# Patient Record
Sex: Female | Born: 1955 | Hispanic: No | Marital: Married | Smoking: Never smoker
Health system: Southern US, Community
[De-identification: ages and names within clinical notes are randomized; demographics above are authoritative.]

## PROBLEM LIST (undated history)

## (undated) DIAGNOSIS — L509 Urticaria, unspecified: Secondary | ICD-10-CM

## (undated) DIAGNOSIS — I10 Essential (primary) hypertension: Secondary | ICD-10-CM

## (undated) DIAGNOSIS — N200 Calculus of kidney: Secondary | ICD-10-CM

## (undated) DIAGNOSIS — H9190 Unspecified hearing loss, unspecified ear: Secondary | ICD-10-CM

## (undated) DIAGNOSIS — E785 Hyperlipidemia, unspecified: Secondary | ICD-10-CM

## (undated) DIAGNOSIS — K602 Anal fissure, unspecified: Secondary | ICD-10-CM

## (undated) DIAGNOSIS — M1990 Unspecified osteoarthritis, unspecified site: Secondary | ICD-10-CM

## (undated) HISTORY — PX: TONSILLECTOMY: SUR1361

## (undated) HISTORY — DX: Urticaria, unspecified: L50.9

## (undated) HISTORY — DX: Essential (primary) hypertension: I10

## (undated) HISTORY — DX: Anal fissure, unspecified: K60.2

## (undated) HISTORY — DX: Unspecified hearing loss, unspecified ear: H91.90

## (undated) HISTORY — PX: OTHER SURGICAL HISTORY: SHX169

## (undated) HISTORY — DX: Unspecified osteoarthritis, unspecified site: M19.90

## (undated) HISTORY — PX: JOINT REPLACEMENT: SHX530

## (undated) HISTORY — PX: ADENOIDECTOMY: SUR15

## (undated) HISTORY — PX: ROTATOR CUFF REPAIR: SHX139

## (undated) HISTORY — DX: Hyperlipidemia, unspecified: E78.5

---
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
## 2011-08-13 ENCOUNTER — Ambulatory Visit
Admission: RE | Admit: 2011-08-13 | Discharge: 2011-08-13 | Disposition: A | Discharge status: Home / Self Care | Payer: BC Managed Care – PPO | Source: Ambulatory Visit | Attending: Family Medicine | Admitting: Family Medicine

## 2011-08-13 ENCOUNTER — Other Ambulatory Visit: Payer: Self-pay | Admitting: Family Medicine

## 2011-08-13 DIAGNOSIS — M25519 Pain in unspecified shoulder: Secondary | ICD-10-CM

## 2012-07-07 ENCOUNTER — Other Ambulatory Visit: Payer: Self-pay | Admitting: Chiropractic Medicine

## 2012-07-07 ENCOUNTER — Ambulatory Visit
Admission: RE | Admit: 2012-07-07 | Discharge: 2012-07-07 | Disposition: A | Discharge status: Home / Self Care | Payer: BC Managed Care – PPO | Source: Ambulatory Visit | Attending: Chiropractor | Admitting: Chiropractor

## 2012-07-07 DIAGNOSIS — R52 Pain, unspecified: Secondary | ICD-10-CM

## 2013-01-05 ENCOUNTER — Other Ambulatory Visit: Payer: Self-pay | Admitting: Family Medicine

## 2013-01-05 DIAGNOSIS — Z1231 Encounter for screening mammogram for malignant neoplasm of breast: Secondary | ICD-10-CM

## 2013-01-05 DIAGNOSIS — M899 Disorder of bone, unspecified: Secondary | ICD-10-CM

## 2013-02-02 ENCOUNTER — Other Ambulatory Visit: Payer: BC Managed Care – PPO

## 2013-02-02 ENCOUNTER — Ambulatory Visit: Payer: BC Managed Care – PPO

## 2013-02-18 ENCOUNTER — Ambulatory Visit
Admission: RE | Admit: 2013-02-18 | Discharge: 2013-02-18 | Disposition: A | Discharge status: Home / Self Care | Payer: BC Managed Care – PPO | Source: Ambulatory Visit | Attending: Family Medicine | Admitting: Family Medicine

## 2013-02-18 DIAGNOSIS — M899 Disorder of bone, unspecified: Secondary | ICD-10-CM

## 2013-02-18 DIAGNOSIS — Z1231 Encounter for screening mammogram for malignant neoplasm of breast: Secondary | ICD-10-CM

## 2013-03-24 ENCOUNTER — Ambulatory Visit: Payer: Self-pay | Admitting: Podiatry

## 2014-04-26 ENCOUNTER — Other Ambulatory Visit: Payer: Self-pay | Admitting: Family Medicine

## 2014-04-26 DIAGNOSIS — Z1231 Encounter for screening mammogram for malignant neoplasm of breast: Secondary | ICD-10-CM

## 2014-05-02 ENCOUNTER — Ambulatory Visit
Admission: RE | Admit: 2014-05-02 | Discharge: 2014-05-02 | Disposition: A | Discharge status: Home / Self Care | Payer: BC Managed Care – PPO | Source: Ambulatory Visit | Attending: Family Medicine | Admitting: Family Medicine

## 2014-05-02 ENCOUNTER — Encounter (INDEPENDENT_AMBULATORY_CARE_PROVIDER_SITE_OTHER): Payer: Self-pay

## 2014-05-02 DIAGNOSIS — Z1231 Encounter for screening mammogram for malignant neoplasm of breast: Secondary | ICD-10-CM

## 2014-08-21 ENCOUNTER — Other Ambulatory Visit: Payer: Self-pay | Admitting: Family Medicine

## 2014-08-21 ENCOUNTER — Ambulatory Visit
Admission: RE | Admit: 2014-08-21 | Discharge: 2014-08-21 | Disposition: A | Discharge status: Home / Self Care | Payer: BC Managed Care – PPO | Source: Ambulatory Visit | Attending: Family Medicine | Admitting: Family Medicine

## 2014-08-21 DIAGNOSIS — R05 Cough: Secondary | ICD-10-CM

## 2014-08-21 DIAGNOSIS — R0781 Pleurodynia: Secondary | ICD-10-CM

## 2014-08-21 DIAGNOSIS — R059 Cough, unspecified: Secondary | ICD-10-CM

## 2014-10-16 ENCOUNTER — Encounter (HOSPITAL_COMMUNITY): Payer: Self-pay | Admitting: Emergency Medicine

## 2014-10-16 ENCOUNTER — Emergency Department (HOSPITAL_COMMUNITY)
Admission: EM | Admit: 2014-10-16 | Discharge: 2014-10-16 | Disposition: A | Discharge status: Home / Self Care | Payer: BC Managed Care – PPO | Source: Home / Self Care | Attending: Family Medicine | Admitting: Family Medicine

## 2014-10-16 ENCOUNTER — Ambulatory Visit (HOSPITAL_COMMUNITY): Payer: BC Managed Care – PPO | Attending: Family Medicine

## 2014-10-16 ENCOUNTER — Emergency Department (HOSPITAL_COMMUNITY): Payer: BC Managed Care – PPO

## 2014-10-16 DIAGNOSIS — R918 Other nonspecific abnormal finding of lung field: Secondary | ICD-10-CM | POA: Insufficient documentation

## 2014-10-16 DIAGNOSIS — J069 Acute upper respiratory infection, unspecified: Secondary | ICD-10-CM | POA: Diagnosis present

## 2014-10-16 DIAGNOSIS — R059 Cough, unspecified: Secondary | ICD-10-CM

## 2014-10-16 DIAGNOSIS — R509 Fever, unspecified: Secondary | ICD-10-CM | POA: Diagnosis not present

## 2014-10-16 DIAGNOSIS — J189 Pneumonia, unspecified organism: Secondary | ICD-10-CM

## 2014-10-16 DIAGNOSIS — R05 Cough: Secondary | ICD-10-CM

## 2014-10-16 HISTORY — DX: Calculus of kidney: N20.0

## 2014-10-16 MED ORDER — LEVOFLOXACIN 500 MG PO TABS: 500.0000 mg | ORAL_TABLET | Freq: Every day | ORAL | Status: DC

## 2014-10-16 NOTE — ED Notes (Signed)
Pt has been suffering from a cough, fever, generalized weakness and pain since Friday, January 22.  Pt has tried night time medication and Aleve and Advil during the day, with little relief.

## 2014-10-16 NOTE — ED Provider Notes (Signed)
Beth Aguilar is a 59 y.o. female who presents to Urgent Care today for cough. Patient has a several day history of cough fever fatigue and nausea. Her maximum temperature at home was 102.11F. She denies any vomiting diarrhea or abdominal pain or chest pain. She notes wheezing and coarse breathing. She's tried over-the-counter medications which helped a little.   Past Medical History  Diagnosis Date  . Kidney stone    Past Surgical History  Procedure Laterality Date  . Cesarean section      x2  . Tonsillectomy    . Joint replacement     History  Substance Use Topics  . Smoking status: Never Smoker   . Smokeless tobacco: Not on file  . Alcohol Use: No   ROS as above Medications: No current facility-administered medications for this encounter.   Current Outpatient Prescriptions  Medication Sig Dispense Refill  . simvastatin (ZOCOR) 20 MG tablet Take 20 mg by mouth daily.    Marland Kitchen. levofloxacin (LEVAQUIN) 500 MG tablet Take 1 tablet (500 mg total) by mouth daily. 10 tablet 0   Allergies  Allergen Reactions  . Compazine [Prochlorperazine]   . Sulfa Antibiotics      Exam:  BP 134/84 mmHg  Pulse 98  Temp(Src) 99.5 F (37.5 C) (Oral)  Resp 18  SpO2 98% Gen: Well NAD nontoxic appearing HEENT: EOMI,  MMM posterior pharynx with minimal cobblestoning. Normal tympanic membranes bilaterally. Lungs: Normal work of breathing. Profound bilateral rhonchi Heart: RRR no MRG Abd: NABS, Soft. Nondistended, Nontender Exts: Brisk capillary refill, warm and well perfused.   No results found for this or any previous visit (from the past 24 hour(s)). Dg Chest 2 View  10/16/2014   CLINICAL DATA:  Upper respiratory infection and fever  EXAM: CHEST  2 VIEW  COMPARISON:  08/21/2014  FINDINGS: There is a left lower lobe infiltrate, new. The right lung is clear. There are no pleural effusions. Hilar, mediastinal and cardiac contours appear unremarkable. Pulmonary vasculature is normal.  IMPRESSION:  Left lower lobe infiltrate, likely pneumonia in this clinical setting.   Electronically Signed   By: Ellery Plunkaniel R Mitchell M.D.   On: 10/16/2014 11:39    Assessment and Plan: 59 y.o. female with community-acquired pneumonia. Treat with Levaquin. Continue over-the-counter medications. Return as needed.  Discussed warning signs or symptoms. Please see discharge instructions. Patient expresses understanding.     Rodolph BongEvan S Daryan Buell, MD 10/16/14 [phone]

## 2014-10-16 NOTE — ED Notes (Signed)
Pt is unable to sign error with e-signature board

## 2014-10-16 NOTE — Discharge Instructions (Signed)
Thank you for coming in today. °Call or go to the emergency room if you get worse, have trouble breathing, have chest pains, or palpitations.  ° °Pneumonia °Pneumonia is an infection of the lungs.  °CAUSES °Pneumonia may be caused by bacteria or a virus. Usually, these infections are caused by breathing infectious particles into the lungs (respiratory tract). °SIGNS AND SYMPTOMS  °· Cough. °· Fever. °· Chest pain. °· Increased rate of breathing. °· Wheezing. °· Mucus production. °DIAGNOSIS  °If you have the common symptoms of pneumonia, your health care provider will typically confirm the diagnosis with a chest X-ray. The X-ray will show an abnormality in the lung (pulmonary infiltrate) if you have pneumonia. Other tests of your blood, urine, or sputum may be done to find the specific cause of your pneumonia. Your health care provider may also do tests (blood gases or pulse oximetry) to see how well your lungs are working. °TREATMENT  °Some forms of pneumonia may be spread to other people when you cough or sneeze. You may be asked to wear a mask before and during your exam. Pneumonia that is caused by bacteria is treated with antibiotic medicine. Pneumonia that is caused by the influenza virus may be treated with an antiviral medicine. Most other viral infections must run their course. These infections will not respond to antibiotics.  °HOME CARE INSTRUCTIONS  °· Cough suppressants may be used if you are losing too much rest. However, coughing protects you by clearing your lungs. You should avoid using cough suppressants if you can. °· Your health care provider may have prescribed medicine if he or she thinks your pneumonia is caused by bacteria or influenza. Finish your medicine even if you start to feel better. °· Your health care provider may also prescribe an expectorant. This loosens the mucus to be coughed up. °· Take medicines only as directed by your health care provider. °· Do not smoke. Smoking is a common  cause of bronchitis and can contribute to pneumonia. If you are a smoker and continue to smoke, your cough may last several weeks after your pneumonia has cleared. °· A cold steam vaporizer or humidifier in your room or home may help loosen mucus. °· Coughing is often worse at night. Sleeping in a semi-upright position in a recliner or using a couple pillows under your head will help with this. °· Get rest as you feel it is needed. Your body will usually let you know when you need to rest. °PREVENTION °A pneumococcal shot (vaccine) is available to prevent a common bacterial cause of pneumonia. This is usually suggested for: °· People over 65 years old. °· Patients on chemotherapy. °· People with chronic lung problems, such as bronchitis or emphysema. °· People with immune system problems. °If you are over 65 or have a high risk condition, you may receive the pneumococcal vaccine if you have not received it before. In some countries, a routine influenza vaccine is also recommended. This vaccine can help prevent some cases of pneumonia. You may be offered the influenza vaccine as part of your care. °If you smoke, it is time to quit. You may receive instructions on how to stop smoking. Your health care provider can provide medicines and counseling to help you quit. °SEEK MEDICAL CARE IF: °You have a fever. °SEEK IMMEDIATE MEDICAL CARE IF:  °· Your illness becomes worse. This is especially true if you are elderly or weakened from any other disease. °· You cannot control your cough with suppressants and   are losing sleep. °· You begin coughing up blood. °· You develop pain which is getting worse or is uncontrolled with medicines. °· Any of the symptoms which initially brought you in for treatment are getting worse rather than better. °· You develop shortness of breath or chest pain. °MAKE SURE YOU:  °· Understand these instructions. °· Will watch your condition. °· Will get help right away if you are not doing well or get  worse. °Document Released: 09/08/2005 Document Revised: 01/23/2014 Document Reviewed: 11/28/2010 °[employer]® Patient Information ©2015 [employer]. This information is not intended to replace advice given to you by your health care provider. Make sure you discuss any questions you have with your health care provider. ° °

## 2014-11-02 ENCOUNTER — Ambulatory Visit (INDEPENDENT_AMBULATORY_CARE_PROVIDER_SITE_OTHER): Payer: BC Managed Care – PPO | Admitting: Internal Medicine

## 2014-11-02 ENCOUNTER — Encounter: Payer: Self-pay | Admitting: Internal Medicine

## 2014-11-02 VITALS — BP 122/64 | HR 78 | Temp 98.3°F | Resp 12 | Ht 60.0 in | Wt 125.8 lb

## 2014-11-02 DIAGNOSIS — Z0000 Encounter for general adult medical examination without abnormal findings: Secondary | ICD-10-CM

## 2014-11-02 DIAGNOSIS — E785 Hyperlipidemia, unspecified: Secondary | ICD-10-CM

## 2014-11-02 MED ORDER — SIMVASTATIN 20 MG PO TABS: 20.0000 mg | ORAL_TABLET | Freq: Every day | ORAL | Status: DC

## 2014-11-02 NOTE — Patient Instructions (Addendum)
We will check your blood work which you can come get done over spring break  and call you back with the results.   We recommend two groups for Ob/Gyn: [facility] and [facility]  If you decide that you want to do something about the veins on your upper legs just let us know or see a vascular doctor (if you need a referral call our office).  We will see you back in the summer for your physical.   Health Maintenance Adopting a healthy lifestyle and getting preventive care can go a long way to promote health and wellness. Talk with your health care provider about what schedule of regular examinations is right for you. This is a good chance for you to check in with your provider about disease prevention and staying healthy. In between checkups, there are plenty of things you can do on your own. Experts have done a lot of research about which lifestyle changes and preventive measures are most likely to keep you healthy. Ask your health care provider for more information. WEIGHT AND DIET  Eat a healthy diet  Be sure to include plenty of vegetables, fruits, low-fat dairy products, and lean protein.  Do not eat a lot of foods high in solid fats, added sugars, or salt.  Get regular exercise. This is one of the most important things you can do for your health.  Most adults should exercise for at least 150 minutes each week. The exercise should increase your heart rate and make you sweat (moderate-intensity exercise).  Most adults should also do strengthening exercises at least twice a week. This is in addition to the moderate-intensity exercise.  Maintain a healthy weight  Body mass index (BMI) is a measurement that can be used to identify possible weight problems. It estimates body fat based on height and weight. Your health care provider can help determine your BMI and help you achieve or maintain a healthy weight.  For females 41 years of age and older:   A BMI below 18.5 is  considered underweight.  A BMI of 18.5 to 24.9 is normal.  A BMI of 25 to 29.9 is considered overweight.  A BMI of 30 and above is considered obese.  Watch levels of cholesterol and blood lipids  You should start having your blood tested for lipids and cholesterol at 58 years of age, then have this test every 5 years.  You may need to have your cholesterol levels checked more often if:  Your lipid or cholesterol levels are high.  You are older than 58 years of age.  You are at high risk for heart disease.  CANCER SCREENING   Lung Cancer  Lung cancer screening is recommended for adults 80-45 years old who are at high risk for lung cancer because of a history of smoking.  A yearly low-dose CT scan of the lungs is recommended for people who:  Currently smoke.  Have quit within the past 15 years.  Have at least a 30-pack-year history of smoking. A pack year is smoking an average of one pack of cigarettes a day for 1 year.  Yearly screening should continue until it has been 15 years since you quit.  Yearly screening should stop if you develop a health problem that would prevent you from having lung cancer treatment.  Breast Cancer  Practice breast self-awareness. This means understanding how your breasts normally appear and feel.  It also means doing regular breast self-exams. Let your health care  provider know about any changes, no matter how small.  If you are in your 20s or 30s, you should have a clinical breast exam (CBE) by a health care provider every 1-3 years as part of a regular health exam.  If you are 79 or older, have a CBE every year. Also consider having a breast X-ray (mammogram) every year.  If you have a family history of breast cancer, talk to your health care provider about genetic screening.  If you are at high risk for breast cancer, talk to your health care provider about having an MRI and a mammogram every year.  Breast cancer gene (BRCA)  assessment is recommended for women who have family members with BRCA-related cancers. BRCA-related cancers include:  Breast.  Ovarian.  Tubal.  Peritoneal cancers.  Results of the assessment will determine the need for genetic counseling and BRCA1 and BRCA2 testing. Cervical Cancer Routine pelvic examinations to screen for cervical cancer are no longer recommended for nonpregnant women who are considered low risk for cancer of the pelvic organs (ovaries, uterus, and vagina) and who do not have symptoms. A pelvic examination may be necessary if you have symptoms including those associated with pelvic infections. Ask your health care provider if a screening pelvic exam is right for you.   The Pap test is the screening test for cervical cancer for women who are considered at risk.  If you had a hysterectomy for a problem that was not cancer or a condition that could lead to cancer, then you no longer need Pap tests.  If you are older than 65 years, and you have had normal Pap tests for the past 10 years, you no longer need to have Pap tests.  If you have had past treatment for cervical cancer or a condition that could lead to cancer, you need Pap tests and screening for cancer for at least 20 years after your treatment.  If you no longer get a Pap test, assess your risk factors if they change (such as having a new sexual partner). This can affect whether you should start being screened again.  Some women have medical problems that increase their chance of getting cervical cancer. If this is the case for you, your health care provider may recommend more frequent screening and Pap tests.  The human papillomavirus (HPV) test is another test that may be used for cervical cancer screening. The HPV test looks for the virus that can cause cell changes in the cervix. The cells collected during the Pap test can be tested for HPV.  The HPV test can be used to screen women 5 years of age and older.  Getting tested for HPV can extend the interval between normal Pap tests from three to five years.  An HPV test also should be used to screen women of any age who have unclear Pap test results.  After 58 years of age, women should have HPV testing as often as Pap tests.  Colorectal Cancer  This type of cancer can be detected and often prevented.  Routine colorectal cancer screening usually begins at 58 years of age and continues through 58 years of age.  Your health care provider may recommend screening at an earlier age if you have risk factors for colon cancer.  Your health care provider may also recommend using home test kits to check for hidden blood in the stool.  A small camera at the end of a tube can be used to examine your colon  directly (sigmoidoscopy or colonoscopy). This is done to check for the earliest forms of colorectal cancer.  Routine screening usually begins at age 21.  Direct examination of the colon should be repeated every 5-10 years through 58 years of age. However, you may need to be screened more often if early forms of precancerous polyps or small growths are found. Skin Cancer  Check your skin from head to toe regularly.  Tell your health care provider about any new moles or changes in moles, especially if there is a change in a mole's shape or color.  Also tell your health care provider if you have a mole that is larger than the size of a pencil eraser.  Always use sunscreen. Apply sunscreen liberally and repeatedly throughout the day.  Protect yourself by wearing long sleeves, pants, a wide-brimmed hat, and sunglasses whenever you are outside. HEART DISEASE, DIABETES, AND HIGH BLOOD PRESSURE   Have your blood pressure checked at least every 1-2 years. High blood pressure causes heart disease and increases the risk of stroke.  If you are between 54 years and 57 years old, ask your health care provider if you should take aspirin to prevent  strokes.  Have regular diabetes screenings. This involves taking a blood sample to check your fasting blood sugar level.  If you are at a normal weight and have a low risk for diabetes, have this test once every three years after 58 years of age.  If you are overweight and have a high risk for diabetes, consider being tested at a younger age or more often. PREVENTING INFECTION  Hepatitis B  If you have a higher risk for hepatitis B, you should be screened for this virus. You are considered at high risk for hepatitis B if:  You were born in a country where hepatitis B is common. Ask your health care provider which countries are considered high risk.  Your parents were born in a high-risk country, and you have not been immunized against hepatitis B (hepatitis B vaccine).  You have HIV or AIDS.  You use needles to inject street drugs.  You live with someone who has hepatitis B.  You have had sex with someone who has hepatitis B.  You get hemodialysis treatment.  You take certain medicines for conditions, including cancer, organ transplantation, and autoimmune conditions. Hepatitis C  Blood testing is recommended for:  Everyone born from [birth year] through 1964.  Anyone with known risk factors for hepatitis C. Sexually transmitted infections (STIs)  You should be screened for sexually transmitted infections (STIs) including gonorrhea and chlamydia if:  You are sexually active and are younger than 58 years of age.  You are older than 58 years of age and your health care provider tells you that you are at risk for this type of infection.  Your sexual activity has changed since you were last screened and you are at an increased risk for chlamydia or gonorrhea. Ask your health care provider if you are at risk.  If you do not have HIV, but are at risk, it may be recommended that you take a prescription medicine daily to prevent HIV infection. This is called pre-exposure prophylaxis  (PrEP). You are considered at risk if:  You are sexually active and do not regularly use condoms or know the HIV status of your partner(s).  You take drugs by injection.  You are sexually active with a partner who has HIV. Talk with your health care provider about whether you are  at high risk of being infected with HIV. If you choose to begin PrEP, you should first be tested for HIV. You should then be tested every 3 months for as long as you are taking PrEP.  PREGNANCY   If you are premenopausal and you may become pregnant, ask your health care provider about preconception counseling.  If you may become pregnant, take 400 to 800 micrograms (mcg) of folic acid every day.  If you want to prevent pregnancy, talk to your health care provider about birth control (contraception). OSTEOPOROSIS AND MENOPAUSE   Osteoporosis is a disease in which the bones lose minerals and strength with aging. This can result in serious bone fractures. Your risk for osteoporosis can be identified using a bone density scan.  If you are 36 years of age or older, or if you are at risk for osteoporosis and fractures, ask your health care provider if you should be screened.  Ask your health care provider whether you should take a calcium or vitamin D supplement to lower your risk for osteoporosis.  Menopause may have certain physical symptoms and risks.  Hormone replacement therapy may reduce some of these symptoms and risks. Talk to your health care provider about whether hormone replacement therapy is right for you.  HOME CARE INSTRUCTIONS   Schedule regular health, dental, and eye exams.  Stay current with your immunizations.   Do not use any tobacco products including cigarettes, chewing tobacco, or electronic cigarettes.  If you are pregnant, do not drink alcohol.  If you are breastfeeding, limit how much and how often you drink alcohol.  Limit alcohol intake to no more than 1 drink per day for  nonpregnant women. One drink equals 12 ounces of beer, 5 ounces of wine, or 1 ounces of hard liquor.  Do not use street drugs.  Do not share needles.  Ask your health care provider for help if you need support or information about quitting drugs.  Tell your health care provider if you often feel depressed.  Tell your health care provider if you have ever been abused or do not feel safe at home. Document Released: 03/24/2011 Document Revised: 01/23/2014 Document Reviewed: 08/10/2013 [facility] Patient Information 2015 [place], [place]. This information is not intended to replace advice given to you by your health care provider. Make sure you discuss any questions you have with your health care provider.

## 2014-11-02 NOTE — Progress Notes (Signed)
Pre visit review using our clinic review tool, if applicable. No additional management support is needed unless otherwise documented below in the visit note. 

## 2014-11-04 ENCOUNTER — Encounter: Payer: Self-pay | Admitting: Internal Medicine

## 2014-11-04 DIAGNOSIS — E785 Hyperlipidemia, unspecified: Secondary | ICD-10-CM | POA: Insufficient documentation

## 2014-11-04 DIAGNOSIS — Z0000 Encounter for general adult medical examination without abnormal findings: Secondary | ICD-10-CM | POA: Insufficient documentation

## 2014-11-04 NOTE — Progress Notes (Signed)
   Subjective:    Patient ID: Beth Aguilar, female    DOB: 01/14/1956, 59 y.o.   MRN: [id number]  HPI The patient is a 59 YO female who comes in for wellness as a new patient. No new complaints although she does have some veins that are prominent on her upper legs. Denies signs of depression, anxiety, acid reflux, chest pains, SOB. 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  [facility], social history, medications, allergies, problem list reviewed and updated.  Review of Systems  Constitutional: Negative for fever, activity change, appetite change, fatigue and unexpected weight change.  HENT: Negative.   Eyes: Negative.   Respiratory: Negative for cough, chest tightness, shortness of breath and wheezing.   Cardiovascular: Negative for chest pain, palpitations and leg swelling.  Gastrointestinal: Negative for abdominal pain, diarrhea, constipation and abdominal distention.  Genitourinary: Negative.   Musculoskeletal: Negative.   Skin: Negative.   Neurological: Negative.   Psychiatric/Behavioral: Negative.       Objective:   Physical Exam  Constitutional: She is oriented to person, place, and time. She appears well-developed and well-nourished.  HENT:  Head: Normocephalic and atraumatic.  Right Ear: External ear normal.  Left Ear: External ear normal.  Eyes: EOM are normal.  Neck: Normal range of motion.  Cardiovascular: Normal rate and regular rhythm.   No murmur heard. Carotids without bruits.   Pulmonary/Chest: Effort normal and breath sounds normal. No respiratory distress. She has no wheezes.  Abdominal: Soft. Bowel sounds are normal.  Musculoskeletal: She exhibits no edema.  Neurological: She is alert and oriented to person, place, and time. Coordination normal.  Skin: Skin is warm and dry.  Some prominent sclerosed veins on the upper leg. No signs of varicose veins at this time.  Psychiatric: She has a normal mood and affect. Her behavior is normal.    Filed Vitals:   11/02/14 1416  BP: 122/64  Pulse: 78  Temp: 98.3 F (36.8 C)  TempSrc: Oral  Resp: 12  Height: 5' (1.524 m)  Weight: 125 lb 12.8 oz (57.063 kg)  SpO2: 97%      Assessment & Plan:

## 2014-11-04 NOTE — Assessment & Plan Note (Signed)
BP normal, she exercises, non-smoker. Declines all immunizations today. Mammogram and PAP smear up to date and sees Gyn for those. Colonoscopy done and next due in 2025. Check BMP.

## 2014-12-19 ENCOUNTER — Other Ambulatory Visit (INDEPENDENT_AMBULATORY_CARE_PROVIDER_SITE_OTHER): Payer: BC Managed Care – PPO

## 2014-12-19 DIAGNOSIS — Z0000 Encounter for general adult medical examination without abnormal findings: Secondary | ICD-10-CM

## 2014-12-19 LAB — LIPID PANEL
CHOLESTEROL: 173 mg/dL (ref 0–200)
HDL: 49.2 mg/dL (ref >39.00)
LDL CALC: 99 mg/dL (ref 0–99)
NONHDL: 123.8
Total CHOL/HDL Ratio: 4
Triglycerides: 123 mg/dL (ref 0.0–149.0)
VLDL: 24.6 mg/dL (ref 0.0–40.0)

## 2014-12-19 LAB — COMPREHENSIVE METABOLIC PANEL
ALK PHOS: 57 U/L (ref 39–117)
ALT: 17 U/L (ref 0–35)
AST: 19 U/L (ref 0–37)
Albumin: 4.2 g/dL (ref 3.5–5.2)
BUN: 14 mg/dL (ref 6–23)
CALCIUM: 9.8 mg/dL (ref 8.4–10.5)
CHLORIDE: 103 meq/L (ref 96–112)
CO2: 30 mEq/L (ref 19–32)
CREATININE: 0.85 mg/dL (ref 0.40–1.20)
GFR: 73.03 mL/min (ref >60.00)
GLUCOSE: 95 mg/dL (ref 70–99)
POTASSIUM: 4.1 meq/L (ref 3.5–5.1)
SODIUM: 139 meq/L (ref 135–145)
TOTAL PROTEIN: 7.2 g/dL (ref 6.0–8.3)
Total Bilirubin: 0.4 mg/dL (ref 0.2–1.2)

## 2014-12-22 ENCOUNTER — Telehealth: Payer: Self-pay

## 2014-12-22 NOTE — Telephone Encounter (Signed)
Left message on voice mail advising pt of dr Loann Quillkollars findings and to call back if any further questions

## 2014-12-22 NOTE — Telephone Encounter (Signed)
-----   Message from Judie BonusElizabeth A Kollar, MD sent at 12/21/2014  9:58 AM EDT ----- Please call her and let her know that her labs are normal and cholesterol is good.

## 2015-02-17 ENCOUNTER — Ambulatory Visit: Payer: BC Managed Care – PPO | Admitting: Family Medicine

## 2015-03-02 ENCOUNTER — Telehealth: Payer: Self-pay | Admitting: Internal Medicine

## 2015-03-02 NOTE — Telephone Encounter (Signed)
She can just call the vein center to get in with them and likely does not need a referral.

## 2015-03-02 NOTE — Telephone Encounter (Signed)
Pt called in and would like a referral to a vein specialist about the veins on her legs.

## 2015-03-05 NOTE — Telephone Encounter (Signed)
Left message informing patient to call the vein clinic and and schedule an appointment.

## 2015-04-30 ENCOUNTER — Encounter: Payer: BC Managed Care – PPO | Admitting: Internal Medicine

## 2015-05-02 LAB — HM MAMMOGRAPHY

## 2015-05-02 LAB — HM PAP SMEAR

## 2015-05-03 LAB — HM DEXA SCAN

## 2015-05-08 ENCOUNTER — Ambulatory Visit (INDEPENDENT_AMBULATORY_CARE_PROVIDER_SITE_OTHER): Payer: BC Managed Care – PPO | Admitting: Internal Medicine

## 2015-05-08 ENCOUNTER — Ambulatory Visit (INDEPENDENT_AMBULATORY_CARE_PROVIDER_SITE_OTHER)
Admission: RE | Admit: 2015-05-08 | Discharge: 2015-05-08 | Disposition: A | Discharge status: Home / Self Care | Payer: BC Managed Care – PPO | Source: Ambulatory Visit | Attending: Internal Medicine | Admitting: Internal Medicine

## 2015-05-08 ENCOUNTER — Encounter: Payer: Self-pay | Admitting: Internal Medicine

## 2015-05-08 ENCOUNTER — Other Ambulatory Visit (INDEPENDENT_AMBULATORY_CARE_PROVIDER_SITE_OTHER): Payer: BC Managed Care – PPO

## 2015-05-08 VITALS — BP 140/90 | HR 90 | Temp 97.5°F | Resp 16 | Ht 60.0 in | Wt 126.0 lb

## 2015-05-08 DIAGNOSIS — R059 Cough, unspecified: Secondary | ICD-10-CM

## 2015-05-08 DIAGNOSIS — J302 Other seasonal allergic rhinitis: Secondary | ICD-10-CM | POA: Insufficient documentation

## 2015-05-08 DIAGNOSIS — R05 Cough: Secondary | ICD-10-CM | POA: Diagnosis not present

## 2015-05-08 DIAGNOSIS — J0121 Acute recurrent ethmoidal sinusitis: Secondary | ICD-10-CM | POA: Insufficient documentation

## 2015-05-08 DIAGNOSIS — Z0000 Encounter for general adult medical examination without abnormal findings: Secondary | ICD-10-CM | POA: Diagnosis not present

## 2015-05-08 LAB — TSH: TSH: 1.52 u[IU]/mL (ref 0.35–4.50)

## 2015-05-08 MED ORDER — AMOXICILLIN 875 MG PO TABS: 875.0000 mg | ORAL_TABLET | Freq: Two times a day (BID) | ORAL | Status: AC

## 2015-05-08 MED ORDER — AZELASTINE HCL 0.1 % NA SOLN: 2.0000 | Freq: Two times a day (BID) | NASAL | Status: DC

## 2015-05-08 NOTE — Patient Instructions (Signed)
Preventive Care for Adults A healthy lifestyle and preventive care can promote health and wellness. Preventive health guidelines for women include the following key practices.  A routine yearly physical is a good way to check with your health care provider about your health and preventive screening. It is a chance to share any concerns and updates on your health and to receive a thorough exam.  Visit your dentist for a routine exam and preventive care every 6 months. Brush your teeth twice a day and floss once a day. Good oral hygiene prevents tooth decay and gum disease.  The frequency of eye exams is based on your age, health, family medical history, use of contact lenses, and other factors. Follow your health care provider's recommendations for frequency of eye exams.  Eat a healthy diet. Foods like vegetables, fruits, whole grains, low-fat dairy products, and lean protein foods contain the nutrients you need without too many calories. Decrease your intake of foods high in solid fats, added sugars, and salt. Eat the right amount of calories for you.Get information about a proper diet from your health care provider, if necessary.  Regular physical exercise is one of the most important things you can do for your health. Most adults should get at least 150 minutes of moderate-intensity exercise (any activity that increases your heart rate and causes you to sweat) each week. In addition, most adults need muscle-strengthening exercises on 2 or more days a week.  Maintain a healthy weight. The body mass index (BMI) is a screening tool to identify possible weight problems. It provides an estimate of body fat based on height and weight. Your health care provider can find your BMI and can help you achieve or maintain a healthy weight.For adults 20 years and older:  A BMI below 18.5 is considered underweight.  A BMI of 18.5 to 24.9 is normal.  A BMI of 25 to 29.9 is considered overweight.  A BMI of  30 and above is considered obese.  Maintain normal blood lipids and cholesterol levels by exercising and minimizing your intake of saturated fat. Eat a balanced diet with plenty of fruit and vegetables. Blood tests for lipids and cholesterol should begin at age 76 and be repeated every 5 years. If your lipid or cholesterol levels are high, you are over 50, or you are at high risk for heart disease, you may need your cholesterol levels checked more frequently.Ongoing high lipid and cholesterol levels should be treated with medicines if diet and exercise are not working.  If you smoke, find out from your health care provider how to quit. If you do not use tobacco, do not start.  Lung cancer screening is recommended for adults aged 22-80 years who are at high risk for developing lung cancer because of a history of smoking. A yearly low-dose CT scan of the lungs is recommended for people who have at least a 30-pack-year history of smoking and are a current smoker or have quit within the past 15 years. A pack year of smoking is smoking an average of 1 pack of cigarettes a day for 1 year (for example: 1 pack a day for 30 years or 2 packs a day for 15 years). Yearly screening should continue until the smoker has stopped smoking for at least 15 years. Yearly screening should be stopped for people who develop a health problem that would prevent them from having lung cancer treatment.  If you are pregnant, do not drink alcohol. If you are breastfeeding,  be very cautious about drinking alcohol. If you are not pregnant and choose to drink alcohol, do not have more than 1 drink per day. One drink is considered to be 12 ounces (355 mL) of beer, 5 ounces (148 mL) of wine, or 1.5 ounces (44 mL) of liquor.  Avoid use of street drugs. Do not share needles with anyone. Ask for help if you need support or instructions about stopping the use of drugs.  High blood pressure causes heart disease and increases the risk of  stroke. Your blood pressure should be checked at least every 1 to 2 years. Ongoing high blood pressure should be treated with medicines if weight loss and exercise do not work.  If you are 75-52 years old, ask your health care provider if you should take aspirin to prevent strokes.  Diabetes screening involves taking a blood sample to check your fasting blood sugar level. This should be done once every 3 years, after age 15, if you are within normal weight and without risk factors for diabetes. Testing should be considered at a younger age or be carried out more frequently if you are overweight and have at least 1 risk factor for diabetes.  Breast cancer screening is essential preventive care for women. You should practice "breast self-awareness." This means understanding the normal appearance and feel of your breasts and may include breast self-examination. Any changes detected, no matter how small, should be reported to a health care provider. Women in their 58s and 30s should have a clinical breast exam (CBE) by a health care provider as part of a regular health exam every 1 to 3 years. After age 16, women should have a CBE every year. Starting at age 53, women should consider having a mammogram (breast X-ray test) every year. Women who have a family history of breast cancer should talk to their health care provider about genetic screening. Women at a high risk of breast cancer should talk to their health care providers about having an MRI and a mammogram every year.  Breast cancer gene (BRCA)-related cancer risk assessment is recommended for women who have family members with BRCA-related cancers. BRCA-related cancers include breast, ovarian, tubal, and peritoneal cancers. Having family members with these cancers may be associated with an increased risk for harmful changes (mutations) in the breast cancer genes BRCA1 and BRCA2. Results of the assessment will determine the need for genetic counseling and  BRCA1 and BRCA2 testing.  Routine pelvic exams to screen for cancer are no longer recommended for nonpregnant women who are considered low risk for cancer of the pelvic organs (ovaries, uterus, and vagina) and who do not have symptoms. Ask your health care provider if a screening pelvic exam is right for you.  If you have had past treatment for cervical cancer or a condition that could lead to cancer, you need Pap tests and screening for cancer for at least 20 years after your treatment. If Pap tests have been discontinued, your risk factors (such as having a new sexual partner) need to be reassessed to determine if screening should be resumed. Some women have medical problems that increase the chance of getting cervical cancer. In these cases, your health care provider may recommend more frequent screening and Pap tests.  The HPV test is an additional test that may be used for cervical cancer screening. The HPV test looks for the virus that can cause the cell changes on the cervix. The cells collected during the Pap test can be  tested for HPV. The HPV test could be used to screen women aged 30 years and older, and should be used in women of any age who have unclear Pap test results. After the age of 30, women should have HPV testing at the same frequency as a Pap test.  Colorectal cancer can be detected and often prevented. Most routine colorectal cancer screening begins at the age of 50 years and continues through age 75 years. However, your health care provider may recommend screening at an earlier age if you have risk factors for colon cancer. On a yearly basis, your health care provider may provide home test kits to check for hidden blood in the stool. Use of a small camera at the end of a tube, to directly examine the colon (sigmoidoscopy or colonoscopy), can detect the earliest forms of colorectal cancer. Talk to your health care provider about this at age 50, when routine screening begins. Direct  exam of the colon should be repeated every 5-10 years through age 75 years, unless early forms of pre-cancerous polyps or small growths are found.  People who are at an increased risk for hepatitis B should be screened for this virus. You are considered at high risk for hepatitis B if:  You were born in a country where hepatitis B occurs often. Talk with your health care provider about which countries are considered high risk.  Your parents were born in a high-risk country and you have not received a shot to protect against hepatitis B (hepatitis B vaccine).  You have HIV or AIDS.  You use needles to inject street drugs.  You live with, or have sex with, someone who has hepatitis B.  You get hemodialysis treatment.  You take certain medicines for conditions like cancer, organ transplantation, and autoimmune conditions.  Hepatitis C blood testing is recommended for all people born from 1945 through 1965 and any individual with known risks for hepatitis C.  Practice safe sex. Use condoms and avoid high-risk sexual practices to reduce the spread of sexually transmitted infections (STIs). STIs include gonorrhea, chlamydia, syphilis, trichomonas, herpes, HPV, and human immunodeficiency virus (HIV). Herpes, HIV, and HPV are viral illnesses that have no cure. They can result in disability, cancer, and death.  You should be screened for sexually transmitted illnesses (STIs) including gonorrhea and chlamydia if:  You are sexually active and are younger than 24 years.  You are older than 24 years and your health care provider tells you that you are at risk for this type of infection.  Your sexual activity has changed since you were last screened and you are at an increased risk for chlamydia or gonorrhea. Ask your health care provider if you are at risk.  If you are at risk of being infected with HIV, it is recommended that you take a prescription medicine daily to prevent HIV infection. This is  called preexposure prophylaxis (PrEP). You are considered at risk if:  You are a heterosexual woman, are sexually active, and are at increased risk for HIV infection.  You take drugs by injection.  You are sexually active with a partner who has HIV.  Talk with your health care provider about whether you are at high risk of being infected with HIV. If you choose to begin PrEP, you should first be tested for HIV. You should then be tested every 3 months for as long as you are taking PrEP.  Osteoporosis is a disease in which the bones lose minerals and strength   with aging. This can result in serious bone fractures or breaks. The risk of osteoporosis can be identified using a bone density scan. Women ages 65 years and over and women at risk for fractures or osteoporosis should discuss screening with their health care providers. Ask your health care provider whether you should take a calcium supplement or vitamin D to reduce the rate of osteoporosis.  Menopause can be associated with physical symptoms and risks. Hormone replacement therapy is available to decrease symptoms and risks. You should talk to your health care provider about whether hormone replacement therapy is right for you.  Use sunscreen. Apply sunscreen liberally and repeatedly throughout the day. You should seek shade when your shadow is shorter than you. Protect yourself by wearing long sleeves, pants, a wide-brimmed hat, and sunglasses year round, whenever you are outdoors.  Once a month, do a whole body skin exam, using a mirror to look at the skin on your back. Tell your health care provider of new moles, moles that have irregular borders, moles that are larger than a pencil eraser, or moles that have changed in shape or color.  Stay current with required vaccines (immunizations).  Influenza vaccine. All adults should be immunized every year.  Tetanus, diphtheria, and acellular pertussis (Td, Tdap) vaccine. Pregnant women should  receive 1 dose of Tdap vaccine during each pregnancy. The dose should be obtained regardless of the length of time since the last dose. Immunization is preferred during the 27th-36th week of gestation. An adult who has not previously received Tdap or who does not know her vaccine status should receive 1 dose of Tdap. This initial dose should be followed by tetanus and diphtheria toxoids (Td) booster doses every 10 years. Adults with an unknown or incomplete history of completing a 3-dose immunization series with Td-containing vaccines should begin or complete a primary immunization series including a Tdap dose. Adults should receive a Td booster every 10 years.  Varicella vaccine. An adult without evidence of immunity to varicella should receive 2 doses or a second dose if she has previously received 1 dose. Pregnant females who do not have evidence of immunity should receive the first dose after pregnancy. This first dose should be obtained before leaving the health care facility. The second dose should be obtained 4-8 weeks after the first dose.  Human papillomavirus (HPV) vaccine. Females aged 13-26 years who have not received the vaccine previously should obtain the 3-dose series. The vaccine is not recommended for use in pregnant females. However, pregnancy testing is not needed before receiving a dose. If a female is found to be pregnant after receiving a dose, no treatment is needed. In that case, the remaining doses should be delayed until after the pregnancy. Immunization is recommended for any person with an immunocompromised condition through the age of 26 years if she did not get any or all doses earlier. During the 3-dose series, the second dose should be obtained 4-8 weeks after the first dose. The third dose should be obtained 24 weeks after the first dose and 16 weeks after the second dose.  Zoster vaccine. One dose is recommended for adults aged 60 years or older unless certain conditions are  present.  Measles, mumps, and rubella (MMR) vaccine. Adults born before 1957 generally are considered immune to measles and mumps. Adults born in 1957 or later should have 1 or more doses of MMR vaccine unless there is a contraindication to the vaccine or there is laboratory evidence of immunity to   each of the three diseases. A routine second dose of MMR vaccine should be obtained at least 28 days after the first dose for students attending postsecondary schools, health care workers, or international travelers. People who received inactivated measles vaccine or an unknown type of measles vaccine during 1963-1967 should receive 2 doses of MMR vaccine. People who received inactivated mumps vaccine or an unknown type of mumps vaccine before 1979 and are at high risk for mumps infection should consider immunization with 2 doses of MMR vaccine. For females of childbearing age, rubella immunity should be determined. If there is no evidence of immunity, females who are not pregnant should be vaccinated. If there is no evidence of immunity, females who are pregnant should delay immunization until after pregnancy. Unvaccinated health care workers born before 1957 who lack laboratory evidence of measles, mumps, or rubella immunity or laboratory confirmation of disease should consider measles and mumps immunization with 2 doses of MMR vaccine or rubella immunization with 1 dose of MMR vaccine.  Pneumococcal 13-valent conjugate (PCV13) vaccine. When indicated, a person who is uncertain of her immunization history and has no record of immunization should receive the PCV13 vaccine. An adult aged 19 years or older who has certain medical conditions and has not been previously immunized should receive 1 dose of PCV13 vaccine. This PCV13 should be followed with a dose of pneumococcal polysaccharide (PPSV23) vaccine. The PPSV23 vaccine dose should be obtained at least 8 weeks after the dose of PCV13 vaccine. An adult aged 19  years or older who has certain medical conditions and previously received 1 or more doses of PPSV23 vaccine should receive 1 dose of PCV13. The PCV13 vaccine dose should be obtained 1 or more years after the last PPSV23 vaccine dose.  Pneumococcal polysaccharide (PPSV23) vaccine. When PCV13 is also indicated, PCV13 should be obtained first. All adults aged 65 years and older should be immunized. An adult younger than age 65 years who has certain medical conditions should be immunized. Any person who resides in a nursing home or long-term care facility should be immunized. An adult smoker should be immunized. People with an immunocompromised condition and certain other conditions should receive both PCV13 and PPSV23 vaccines. People with human immunodeficiency virus (HIV) infection should be immunized as soon as possible after diagnosis. Immunization during chemotherapy or radiation therapy should be avoided. Routine use of PPSV23 vaccine is not recommended for American Indians, [place] Natives, or people younger than 65 years unless there are medical conditions that require PPSV23 vaccine. When indicated, people who have unknown immunization and have no record of immunization should receive PPSV23 vaccine. One-time revaccination 5 years after the first dose of PPSV23 is recommended for people aged 19-64 years who have chronic kidney failure, nephrotic syndrome, asplenia, or immunocompromised conditions. People who received 1-2 doses of PPSV23 before age 65 years should receive another dose of PPSV23 vaccine at age 65 years or later if at least 5 years have passed since the previous dose. Doses of PPSV23 are not needed for people immunized with PPSV23 at or after age 65 years.  Meningococcal vaccine. Adults with asplenia or persistent complement component deficiencies should receive 2 doses of quadrivalent meningococcal conjugate (MenACWY-D) vaccine. The doses should be obtained at least 2 months apart.  Microbiologists working with certain meningococcal bacteria, military recruits, people at risk during an outbreak, and people who travel to or live in countries with a high rate of meningitis should be immunized. A first-year college student up through age   21 years who is living in a residence hall should receive a dose if she did not receive a dose on or after her 16th birthday. Adults who have certain high-risk conditions should receive one or more doses of vaccine.  Hepatitis A vaccine. Adults who wish to be protected from this disease, have certain high-risk conditions, work with hepatitis A-infected animals, work in hepatitis A research labs, or travel to or work in countries with a high rate of hepatitis A should be immunized. Adults who were previously unvaccinated and who anticipate close contact with an international adoptee during the first 60 days after arrival in the [place] from a country with a high rate of hepatitis A should be immunized.  Hepatitis B vaccine. Adults who wish to be protected from this disease, have certain high-risk conditions, may be exposed to blood or other infectious body fluids, are household contacts or sex partners of hepatitis B positive people, are clients or workers in certain care facilities, or travel to or work in countries with a high rate of hepatitis B should be immunized.  Haemophilus influenzae type b (Hib) vaccine. A previously unvaccinated person with asplenia or sickle cell disease or having a scheduled splenectomy should receive 1 dose of Hib vaccine. Regardless of previous immunization, a recipient of a hematopoietic stem cell transplant should receive a 3-dose series 6-12 months after her successful transplant. Hib vaccine is not recommended for adults with HIV infection. Preventive Services / Frequency Ages 64 to 68 years  Blood pressure check.** / Every 1 to 2 years.  Lipid and cholesterol check.** / Every 5 years beginning at age  22.  Clinical breast exam.** / Every 3 years for women in their 88s and 53s.  BRCA-related cancer risk assessment.** / For women who have family members with a BRCA-related cancer (breast, ovarian, tubal, or peritoneal cancers).  Pap test.** / Every 2 years from ages 90 through 51. Every 3 years starting at age 21 through age 56 or 3 with a history of 3 consecutive normal Pap tests.  HPV screening.** / Every 3 years from ages 24 through ages 1 to 46 with a history of 3 consecutive normal Pap tests.  Hepatitis C blood test.** / For any individual with known risks for hepatitis C.  Skin self-exam. / Monthly.  Influenza vaccine. / Every year.  Tetanus, diphtheria, and acellular pertussis (Tdap, Td) vaccine.** / Consult your health care provider. Pregnant women should receive 1 dose of Tdap vaccine during each pregnancy. 1 dose of Td every 10 years.  Varicella vaccine.** / Consult your health care provider. Pregnant females who do not have evidence of immunity should receive the first dose after pregnancy.  HPV vaccine. / 3 doses over 6 months, if 72 and younger. The vaccine is not recommended for use in pregnant females. However, pregnancy testing is not needed before receiving a dose.  Measles, mumps, rubella (MMR) vaccine.** / You need at least 1 dose of MMR if you were born in 1957 or later. You may also need a 2nd dose. For females of childbearing age, rubella immunity should be determined. If there is no evidence of immunity, females who are not pregnant should be vaccinated. If there is no evidence of immunity, females who are pregnant should delay immunization until after pregnancy.  Pneumococcal 13-valent conjugate (PCV13) vaccine.** / Consult your health care provider.  Pneumococcal polysaccharide (PPSV23) vaccine.** / 1 to 2 doses if you smoke cigarettes or if you have certain conditions.  Meningococcal vaccine.** /  1 dose if you are age 19 to 21 years and a first-year college  student living in a residence hall, or have one of several medical conditions, you need to get vaccinated against meningococcal disease. You may also need additional booster doses.  Hepatitis A vaccine.** / Consult your health care provider.  Hepatitis B vaccine.** / Consult your health care provider.  Haemophilus influenzae type b (Hib) vaccine.** / Consult your health care provider. Ages 40 to 64 years  Blood pressure check.** / Every 1 to 2 years.  Lipid and cholesterol check.** / Every 5 years beginning at age 20 years.  Lung cancer screening. / Every year if you are aged 55-80 years and have a 30-pack-year history of smoking and currently smoke or have quit within the past 15 years. Yearly screening is stopped once you have quit smoking for at least 15 years or develop a health problem that would prevent you from having lung cancer treatment.  Clinical breast exam.** / Every year after age 40 years.  BRCA-related cancer risk assessment.** / For women who have family members with a BRCA-related cancer (breast, ovarian, tubal, or peritoneal cancers).  Mammogram.** / Every year beginning at age 40 years and continuing for as long as you are in good health. Consult with your health care provider.  Pap test.** / Every 3 years starting at age 30 years through age 65 or 70 years with a history of 3 consecutive normal Pap tests.  HPV screening.** / Every 3 years from ages 30 years through ages 65 to 70 years with a history of 3 consecutive normal Pap tests.  Fecal occult blood test (FOBT) of stool. / Every year beginning at age 50 years and continuing until age 75 years. You may not need to do this test if you get a colonoscopy every 10 years.  Flexible sigmoidoscopy or colonoscopy.** / Every 5 years for a flexible sigmoidoscopy or every 10 years for a colonoscopy beginning at age 50 years and continuing until age 75 years.  Hepatitis C blood test.** / For all people born from 1945 through  1965 and any individual with known risks for hepatitis C.  Skin self-exam. / Monthly.  Influenza vaccine. / Every year.  Tetanus, diphtheria, and acellular pertussis (Tdap/Td) vaccine.** / Consult your health care provider. Pregnant women should receive 1 dose of Tdap vaccine during each pregnancy. 1 dose of Td every 10 years.  Varicella vaccine.** / Consult your health care provider. Pregnant females who do not have evidence of immunity should receive the first dose after pregnancy.  Zoster vaccine.** / 1 dose for adults aged 60 years or older.  Measles, mumps, rubella (MMR) vaccine.** / You need at least 1 dose of MMR if you were born in 1957 or later. You may also need a 2nd dose. For females of childbearing age, rubella immunity should be determined. If there is no evidence of immunity, females who are not pregnant should be vaccinated. If there is no evidence of immunity, females who are pregnant should delay immunization until after pregnancy.  Pneumococcal 13-valent conjugate (PCV13) vaccine.** / Consult your health care provider.  Pneumococcal polysaccharide (PPSV23) vaccine.** / 1 to 2 doses if you smoke cigarettes or if you have certain conditions.  Meningococcal vaccine.** / Consult your health care provider.  Hepatitis A vaccine.** / Consult your health care provider.  Hepatitis B vaccine.** / Consult your health care provider.  Haemophilus influenzae type b (Hib) vaccine.** / Consult your health care provider. Ages 65   years and over  Blood pressure check.** / Every 1 to 2 years.  Lipid and cholesterol check.** / Every 5 years beginning at age 22 years.  Lung cancer screening. / Every year if you are aged 73-80 years and have a 30-pack-year history of smoking and currently smoke or have quit within the past 15 years. Yearly screening is stopped once you have quit smoking for at least 15 years or develop a health problem that would prevent you from having lung cancer  treatment.  Clinical breast exam.** / Every year after age 4 years.  BRCA-related cancer risk assessment.** / For women who have family members with a BRCA-related cancer (breast, ovarian, tubal, or peritoneal cancers).  Mammogram.** / Every year beginning at age 40 years and continuing for as long as you are in good health. Consult with your health care provider.  Pap test.** / Every 3 years starting at age 9 years through age 34 or 91 years with 3 consecutive normal Pap tests. Testing can be stopped between 65 and 70 years with 3 consecutive normal Pap tests and no abnormal Pap or HPV tests in the past 10 years.  HPV screening.** / Every 3 years from ages 57 years through ages 64 or 45 years with a history of 3 consecutive normal Pap tests. Testing can be stopped between 65 and 70 years with 3 consecutive normal Pap tests and no abnormal Pap or HPV tests in the past 10 years.  Fecal occult blood test (FOBT) of stool. / Every year beginning at age 15 years and continuing until age 17 years. You may not need to do this test if you get a colonoscopy every 10 years.  Flexible sigmoidoscopy or colonoscopy.** / Every 5 years for a flexible sigmoidoscopy or every 10 years for a colonoscopy beginning at age 86 years and continuing until age 71 years.  Hepatitis C blood test.** / For all people born from [birth year] through 1964 and any individual with known risks for hepatitis C.  Osteoporosis screening.** / A one-time screening for women ages 83 years and over and women at risk for fractures or osteoporosis.  Skin self-exam. / Monthly.  Influenza vaccine. / Every year.  Tetanus, diphtheria, and acellular pertussis (Tdap/Td) vaccine.** / 1 dose of Td every 10 years.  Varicella vaccine.** / Consult your health care provider.  Zoster vaccine.** / 1 dose for adults aged 61 years or older.  Pneumococcal 13-valent conjugate (PCV13) vaccine.** / Consult your health care provider.  Pneumococcal  polysaccharide (PPSV23) vaccine.** / 1 dose for all adults aged 28 years and older.  Meningococcal vaccine.** / Consult your health care provider.  Hepatitis A vaccine.** / Consult your health care provider.  Hepatitis B vaccine.** / Consult your health care provider.  Haemophilus influenzae type b (Hib) vaccine.** / Consult your health care provider. ** Family history and personal history of risk and conditions may change your health care provider's recommendations. Document Released: 11/04/2001 Document Revised: 01/23/2014 Document Reviewed: 02/03/2011 [facility] Patient Information 2015 [place], [place]. This information is not intended to replace advice given to you by your health care provider. Make sure you discuss any questions you have with your health care provider.

## 2015-05-08 NOTE — Progress Notes (Signed)
Pre visit review using our clinic review tool, if applicable. No additional management support is needed unless otherwise documented below in the visit note. 

## 2015-05-09 ENCOUNTER — Encounter: Payer: Self-pay | Admitting: Internal Medicine

## 2015-05-09 NOTE — Progress Notes (Signed)
Subjective:  Patient ID: Beth Aguilar, female    DOB: 1956/09/19  Age: 59 y.o. MRN: [id number]  CC: Annual Exam; Cough; Allergic Rhinitis ; and Sinusitis   HPI Beth Aguilar presents for a physical but she also complains of a 3 month history of cough productive of brown phlegm with postnasal drip, runny nose, nasal congestion, facial pain. She saw her gynecologist one week ago and had a Pap smear and mammogram done.  History Beth Aguilar has a past medical history of Kidney stone.   She has past surgical history that includes Cesarean section; Tonsillectomy; and Joint replacement.   Her family history includes Arthritis in her mother; Depression in her mother; Heart disease in her mother; Hyperlipidemia in her mother; Hypertension in her mother; Stroke in her father.She reports that she has never smoked. She does not have any smokeless tobacco history on file. She reports that she does not drink alcohol or use illicit drugs.  Outpatient Prescriptions Prior to Visit  Medication Sig Dispense Refill  . simvastatin (ZOCOR) 20 MG tablet Take 1 tablet (20 mg total) by mouth daily. 90 tablet 3   No facility-administered medications prior to visit.    ROS Review of Systems  Constitutional: Negative.   HENT: Positive for congestion, postnasal drip, rhinorrhea and sinus pressure. Negative for facial swelling, hearing loss, sneezing, sore throat, tinnitus, trouble swallowing and voice change.   Eyes: Negative.   Respiratory: Positive for cough. Negative for apnea, choking, chest tightness, shortness of breath, wheezing and stridor.   Cardiovascular: Negative.  Negative for chest pain, palpitations and leg swelling.  Gastrointestinal: Negative.  Negative for nausea, vomiting, abdominal pain, diarrhea, constipation and blood in stool.  Endocrine: Negative.   Genitourinary: Negative.   Musculoskeletal: Negative.  Negative for myalgias, back pain and arthralgias.  Skin: Negative.     Allergic/Immunologic: Negative.   Neurological: Negative.   Hematological: Negative.   Psychiatric/Behavioral: Negative.     Objective:  BP 140/90 mmHg  Pulse 90  Temp(Src) 97.5 F (36.4 C) (Oral)  Resp 16  Ht 5' (1.524 m)  Wt 126 lb (57.153 kg)  BMI 24.61 kg/m2  SpO2 96%  Physical Exam  Constitutional: She is oriented to person, place, and time. She appears well-developed and well-nourished.  Non-toxic appearance. She does not have a sickly appearance. She does not appear ill. No distress.  HENT:  Head: Normocephalic and atraumatic.  Right Ear: Hearing, tympanic membrane, external ear and ear canal normal.  Left Ear: Hearing, tympanic membrane, external ear and ear canal normal.  Nose: Mucosal edema and rhinorrhea present. No sinus tenderness or nasal deformity. Right sinus exhibits no maxillary sinus tenderness and no frontal sinus tenderness. Left sinus exhibits maxillary sinus tenderness. Left sinus exhibits no frontal sinus tenderness.  Mouth/Throat: Oropharynx is clear and moist and mucous membranes are normal. Mucous membranes are not pale, not dry and not cyanotic. No oral lesions. No trismus in the jaw. No uvula swelling. No oropharyngeal exudate, posterior oropharyngeal edema, posterior oropharyngeal erythema or tonsillar abscesses.  Eyes: Conjunctivae are normal. Right eye exhibits no discharge. Left eye exhibits no discharge. No scleral icterus.  Neck: Normal range of motion. Neck supple. No JVD present. No tracheal deviation present. No thyromegaly present.  Cardiovascular: Normal rate, regular rhythm, normal heart sounds and intact distal pulses.  Exam reveals no gallop and no friction rub.   No murmur heard. Pulmonary/Chest: Effort normal and breath sounds normal. No stridor. No respiratory distress. She has no wheezes. She has no rales.  She exhibits no tenderness.  Abdominal: Soft. Bowel sounds are normal. She exhibits no distension and no mass. There is no tenderness.  There is no rebound and no guarding.  Musculoskeletal: Normal range of motion. She exhibits no edema or tenderness.  Lymphadenopathy:    She has no cervical adenopathy.  Neurological: She is oriented to person, place, and time.  Skin: Skin is warm and dry. No rash noted. She is not diaphoretic. No erythema. No pallor.  Psychiatric: She has a normal mood and affect. Her behavior is normal. Judgment and thought content normal.  Vitals reviewed.     Assessment & Plan:   Beth Aguilar was seen today for annual exam, cough, allergic rhinitis  and sinusitis.  Diagnoses and all orders for this visit:  Cough- her exam and chest x-ray are normal, she appears to be coughing from the sinus infection, will treat the infection. -     DG Chest 2 View; Future  Other seasonal allergic rhinitis- she tells me that she previously used Flonase nasal spray but says it caused her lose her hearing, I will try an antihistamine nasal spray to help relieve her symptoms. She also tells me that she has side effects from oral antihistamines and decongestants so I will avoid those. -     azelastine (ASTELIN) 0.1 % nasal spray; Place 2 sprays into both nostrils 2 (two) times daily. Use in each nostril as directed  Acute recurrent ethmoidal sinusitis- will treat the infection with Amoxil -     amoxicillin (AMOXIL) 875 MG tablet; Take 1 tablet (875 mg total) by mouth 2 (two) times daily.  Routine general medical examination at a health care facility- exam done, vaccines reviewed and updated, she had a Pap smear, mammogram and DEXA scan done 1 week ago, most labs were done earlier this year and they were within normal limits. Her TSH is normal. -     TSH; Future   I am having Beth Aguilar start on azelastine and amoxicillin. I am also having her maintain her simvastatin.  Meds ordered this encounter  Medications  . azelastine (ASTELIN) 0.1 % nasal spray    Sig: Place 2 sprays into both nostrils 2 (two) times daily. Use in  each nostril as directed    Dispense:  30 mL    Refill:  12  . amoxicillin (AMOXIL) 875 MG tablet    Sig: Take 1 tablet (875 mg total) by mouth 2 (two) times daily.    Dispense:  20 tablet    Refill:  0     Follow-up: Return in about 3 weeks (around 05/29/2015).  Sanda Linger, MD

## 2015-08-10 ENCOUNTER — Encounter: Payer: Self-pay | Admitting: Family

## 2015-08-10 ENCOUNTER — Ambulatory Visit (INDEPENDENT_AMBULATORY_CARE_PROVIDER_SITE_OTHER): Payer: BC Managed Care – PPO | Admitting: Family

## 2015-08-10 VITALS — BP 120/70 | HR 80 | Temp 97.7°F | Resp 18 | Ht 60.0 in | Wt 123.0 lb

## 2015-08-10 DIAGNOSIS — J069 Acute upper respiratory infection, unspecified: Secondary | ICD-10-CM | POA: Insufficient documentation

## 2015-08-10 NOTE — Patient Instructions (Signed)
Thank you for choosing [facility].  Summary/Instructions:   If your symptoms worsen or fail to improve, please contact our office for further instruction, or in case of emergency go directly to the emergency room at the closest medical facility.    Upper Respiratory Infection, Adult Most upper respiratory infections (URIs) are a viral infection of the air passages leading to the lungs. A URI affects the nose, throat, and upper air passages. The most common type of URI is nasopharyngitis and is typically referred to as "the common cold." URIs run their course and usually go away on their own. Most of the time, a URI does not require medical attention, but sometimes a bacterial infection in the upper airways can follow a viral infection. This is called a secondary infection. Sinus and middle ear infections are common types of secondary upper respiratory infections. Bacterial pneumonia can also complicate a URI. A URI can worsen asthma and chronic obstructive pulmonary disease (COPD). Sometimes, these complications can require emergency medical care and may be life threatening.  CAUSES Almost all URIs are caused by viruses. A virus is a type of germ and can spread from one person to another.  RISKS FACTORS You may be at risk for a URI if:   You smoke.   You have chronic heart or lung disease.  You have a weakened defense (immune) system.   You are very young or very old.   You have nasal allergies or asthma.  You work in crowded or poorly ventilated areas.  You work in health care facilities or schools. SIGNS AND SYMPTOMS  Symptoms typically develop 2-3 days after you come in contact with a cold virus. Most viral URIs last 7-10 days. However, viral URIs from the influenza virus (flu virus) can last 14-18 days and are typically more severe. Symptoms may include:   Runny or stuffy (congested) nose.   Sneezing.   Cough.   Sore throat.   Headache.   Fatigue.    Fever.   Loss of appetite.   Pain in your forehead, behind your eyes, and over your cheekbones (sinus pain).  Muscle aches.  DIAGNOSIS  Your health care Kenyia Wambolt may diagnose a URI by:  Physical exam.  Tests to check that your symptoms are not due to another condition such as:  Strep throat.  Sinusitis.  Pneumonia.  Asthma. TREATMENT  A URI goes away on its own with time. It cannot be cured with medicines, but medicines may be prescribed or recommended to relieve symptoms. Medicines may help:  Reduce your fever.  Reduce your cough.  Relieve nasal congestion. HOME CARE INSTRUCTIONS   Take medicines only as directed by your health care Denai Caba.   Gargle warm saltwater or take cough drops to comfort your throat as directed by your health care Cristalle Rohm.  Use a warm mist humidifier or inhale steam from a shower to increase air moisture. This may make it easier to breathe.  Drink enough fluid to keep your urine clear or pale yellow.   Eat soups and other clear broths and maintain good nutrition.   Rest as needed.   Return to work when your temperature has returned to normal or as your health care Tracee Mccreery advises. You may need to stay home longer to avoid infecting others. You can also use a face mask and careful hand washing to prevent spread of the virus.  Increase the usage of your inhaler if you have asthma.   Do not use any tobacco products, including  cigarettes, chewing tobacco, or electronic cigarettes. If you need help quitting, ask your health care Aramis Zobel. PREVENTION  The best way to protect yourself from getting a cold is to practice good hygiene.   Avoid oral or hand contact with people with cold symptoms.   Wash your hands often if contact occurs.  There is no clear evidence that vitamin C, vitamin E, echinacea, or exercise reduces the chance of developing a cold. However, it is always recommended to get plenty of rest, exercise, and  practice good nutrition.  SEEK MEDICAL CARE IF:   You are getting worse rather than better.   Your symptoms are not controlled by medicine.   You have chills.  You have worsening shortness of breath.  You have brown or red mucus.  You have yellow or brown nasal discharge.  You have pain in your face, especially when you bend forward.  You have a fever.  You have swollen neck glands.  You have pain while swallowing.  You have white areas in the back of your throat. SEEK IMMEDIATE MEDICAL CARE IF:   You have severe or persistent:  Headache.  Ear pain.  Sinus pain.  Chest pain.  You have chronic lung disease and any of the following:  Wheezing.  Prolonged cough.  Coughing up blood.  A change in your usual mucus.  You have a stiff neck.  You have changes in your:  Vision.  Hearing.  Thinking.  Mood. MAKE SURE YOU:   Understand these instructions.  Will watch your condition.  Will get help right away if you are not doing well or get worse.   This information is not intended to replace advice given to you by your health care Macauley Mossberg. Make sure you discuss any questions you have with your health care Taylore Hinde.   Document Released: 03/04/2001 Document Revised: 01/23/2015 Document Reviewed: 12/14/2013 Elsevier Interactive Patient Education [employer]

## 2015-08-10 NOTE — Assessment & Plan Note (Signed)
Symptoms and exam consistent with acute upper respiratory infection. Continue conservative treatment with over-the-counter medications as needed for symptom relief and supportive care. Follow-up if symptoms worsen or fail to improve. 

## 2015-08-10 NOTE — Progress Notes (Signed)
   Subjective:    Patient ID: Beth Aguilar, female    DOB: [birth year], 59 y.o.   MRN: [id number]  Chief Complaint  Patient presents with  . Sore Throat    starting yesterday she had a sore throat, weakness, fever of 102 and vomitting, having trouble swallowing    HPI:  Beth Aguilar is a 59 y.o. female who  has a past medical history of Kidney stone. and presents today for an acute office visit.   Associated symptoms of sore throat, congestion, weakness, vomiting, and fever have a going on for approximately one day. Tmax of 102. Modifying factors include Alkaseltzer nighttime and Tylenol or ibuprofen which helped with her symptoms. Reports the course has improved since last evening.   Allergies  Allergen Reactions  . Flonase [Fluticasone Propionate] Other (See Comments)    hearing  . Compazine [Prochlorperazine]   . Gadolinium Derivatives   . Sulfa Antibiotics      Current Outpatient Prescriptions on File Prior to Visit  Medication Sig Dispense Refill  . azelastine (ASTELIN) 0.1 % nasal spray Place 2 sprays into both nostrils 2 (two) times daily. Use in each nostril as directed 30 mL 12  . simvastatin (ZOCOR) 20 MG tablet Take 1 tablet (20 mg total) by mouth daily. 90 tablet 3   No current facility-administered medications on file prior to visit.    Review of Systems  Constitutional: Positive for fever and fatigue.  HENT: Positive for congestion and sore throat.   Neurological: Positive for weakness.      Objective:    BP 120/70 mmHg  Pulse 80  Temp(Src) 97.7 F (36.5 C) (Oral)  Resp 18  Ht 5' (1.524 m)  Wt 123 lb (55.792 kg)  BMI 24.02 kg/m2  SpO2 96% Nursing note and vital signs reviewed.  Physical Exam  Constitutional: She is oriented to person, place, and time. She appears well-developed and well-nourished. No distress.  HENT:  Right Ear: Hearing, tympanic membrane, external ear and ear canal normal.  Left Ear: Hearing, tympanic membrane, external ear and  ear canal normal.  Nose: Right sinus exhibits no maxillary sinus tenderness and no frontal sinus tenderness. Left sinus exhibits no maxillary sinus tenderness and no frontal sinus tenderness.  Mouth/Throat: Uvula is midline, oropharynx is clear and moist and mucous membranes are normal.  Eyes: Conjunctivae are normal.  Cardiovascular: Normal rate, regular rhythm, normal heart sounds and intact distal pulses.   Pulmonary/Chest: Effort normal and breath sounds normal.  Neurological: She is alert and oriented to person, place, and time.  Skin: Skin is warm and dry.  Psychiatric: She has a normal mood and affect. Her behavior is normal. Judgment and thought content normal.       Assessment & Plan:   Problem List Items Addressed This Visit      Respiratory   Acute upper respiratory infection - Primary    Symptoms and exam consistent with acute upper respiratory infection. Continue conservative treatment with over-the-counter medications as needed for symptom relief and supportive care. Follow-up if symptoms worsen or fail to improve.

## 2015-08-10 NOTE — Progress Notes (Signed)
Pre visit review using our clinic review tool, if applicable. No additional management support is needed unless otherwise documented below in the visit note. 

## 2015-08-11 ENCOUNTER — Encounter: Payer: Self-pay | Admitting: Family Medicine

## 2015-08-11 ENCOUNTER — Other Ambulatory Visit (HOSPITAL_COMMUNITY)
Admit: 2015-08-11 | Discharge: 2015-08-11 | Disposition: A | Discharge status: Home / Self Care | Payer: BC Managed Care – PPO | Attending: Family Medicine | Admitting: Family Medicine

## 2015-08-11 ENCOUNTER — Ambulatory Visit (INDEPENDENT_AMBULATORY_CARE_PROVIDER_SITE_OTHER): Payer: BC Managed Care – PPO | Admitting: Family Medicine

## 2015-08-11 ENCOUNTER — Other Ambulatory Visit (HOSPITAL_COMMUNITY)
Admit: 2015-08-11 | Discharge: 2015-08-11 | Disposition: A | Discharge status: Home / Self Care | Payer: BC Managed Care – PPO | Admitting: Family Medicine

## 2015-08-11 VITALS — BP 140/90 | HR 80 | Temp 99.7°F | Wt 123.0 lb

## 2015-08-11 DIAGNOSIS — R21 Rash and other nonspecific skin eruption: Secondary | ICD-10-CM | POA: Insufficient documentation

## 2015-08-11 DIAGNOSIS — J029 Acute pharyngitis, unspecified: Secondary | ICD-10-CM | POA: Insufficient documentation

## 2015-08-11 LAB — POCT RAPID STREP A (OFFICE): Rapid Strep A Screen: NEGATIVE

## 2015-08-11 MED ORDER — AMOXICILLIN 500 MG PO CAPS: 1000.0000 mg | ORAL_CAPSULE | Freq: Two times a day (BID) | ORAL | Status: DC

## 2015-08-11 NOTE — Assessment & Plan Note (Signed)
Very consistent with strep  rash given appearance.

## 2015-08-11 NOTE — Assessment & Plan Note (Signed)
Neg rapid strep, but symptoms and rash very consistent with strep.  Send throat culture.  Treat empirically with amox until culture returns.  Ibuprofen 800 mg every 8 hours for throat pain.

## 2015-08-11 NOTE — Addendum Note (Signed)
Addended by: Kern ReapVEREEN, Omayra Tulloch B on: 08/11/2015 10:21 AM   Modules accepted: Orders

## 2015-08-11 NOTE — Patient Instructions (Addendum)
We will call with results of throat culture.  Treat with  Amoxicillin x 10 days.  Ibuprofen 800 mg every 8 hours for throat pain.

## 2015-08-11 NOTE — Progress Notes (Signed)
Pre visit review using our clinic review tool, if applicable. No additional management support is needed unless otherwise documented below in the visit note. 

## 2015-08-11 NOTE — Progress Notes (Signed)
   Subjective:    Patient ID: Beth MeagerLaurie Rowland, female    DOB: [birth year], 59 y.o.   MRN: [id number]  Sore Throat  This is a new problem. Episode onset: 48 hours. The problem has been gradually worsening. Neither side of throat is experiencing more pain than the other. The maximum temperature recorded prior to her arrival was 102 - 102.9 F. The fever has been present for 1 to 2 days. The pain is moderate. Pertinent negatives include no abdominal pain, ear pain or shortness of breath. Associated symptoms comments: Body aches few episodes of emesis, occ diarrhea  New rash in last 24 hours fine red sand paper rash on abdomen. She has had exposure to strep. She has tried NSAIDs for the symptoms. The treatment provided moderate relief.   Seen yesterday.. felt that it was likely viral infection.    Review of Systems  Constitutional: Positive for fever and fatigue.  HENT: Negative for ear pain.   Eyes: Negative for pain.  Respiratory: Negative for chest tightness and shortness of breath.   Cardiovascular: Negative for chest pain, palpitations and leg swelling.  Gastrointestinal: Negative for abdominal pain.  Genitourinary: Negative for dysuria.  Skin: Positive for rash.       Objective:   Physical Exam  Constitutional: Vital signs are normal. She appears well-developed and well-nourished. She is cooperative.  Non-toxic appearance. She does not appear ill. No distress.  HENT:  Head: Normocephalic.  Right Ear: Hearing, tympanic membrane, external ear and ear canal normal. Tympanic membrane is not erythematous, not retracted and not bulging. No middle ear effusion.  Left Ear: Hearing, tympanic membrane, external ear and ear canal normal. Tympanic membrane is not erythematous, not retracted and not bulging.  No middle ear effusion.  Nose: No mucosal edema or rhinorrhea. Right sinus exhibits no maxillary sinus tenderness and no frontal sinus tenderness. Left sinus exhibits no maxillary sinus  tenderness and no frontal sinus tenderness.  Mouth/Throat: Uvula is midline and mucous membranes are normal. Posterior oropharyngeal erythema present. No oropharyngeal exudate or posterior oropharyngeal edema.  Eyes: Conjunctivae, EOM and lids are normal. Pupils are equal, round, and reactive to light. Lids are everted and swept, no foreign bodies found.  Neck: Trachea normal and normal range of motion. Neck supple. Carotid bruit is not present. No thyroid mass and no thyromegaly present.  Cardiovascular: Normal rate, regular rhythm, S1 normal, S2 normal, normal heart sounds, intact distal pulses and normal pulses.  Exam reveals no gallop and no friction rub.   No murmur heard. Pulmonary/Chest: Effort normal and breath sounds normal. No tachypnea. No respiratory distress. She has no decreased breath sounds. She has no wheezes. She has no rhonchi. She has no rales.  Abdominal: Soft. Normal appearance and bowel sounds are normal. There is no tenderness.  Neurological: She is alert.  Skin: Skin is warm, dry and intact. No rash noted.  Fine  Red sand paper rash across arms and torso and neck  Psychiatric: Her speech is normal and behavior is normal. Judgment and thought content normal. Her mood appears not anxious. Cognition and memory are normal. She does not exhibit a depressed mood.          Assessment & Plan:

## 2015-08-14 ENCOUNTER — Telehealth: Payer: Self-pay | Admitting: *Deleted

## 2015-08-14 ENCOUNTER — Telehealth: Payer: Self-pay | Admitting: Internal Medicine

## 2015-08-14 LAB — CULTURE, GROUP A STREP: Strep A Culture: POSITIVE — AB

## 2015-08-14 MED ORDER — TRIAMCINOLONE ACETONIDE 0.1 % EX CREA: 1.0000 "application " | TOPICAL_CREAM | Freq: Two times a day (BID) | CUTANEOUS | Status: DC

## 2015-08-14 NOTE — Telephone Encounter (Signed)
Pt husband called in and said that pt is itching so back with rash that she has.  She wanted to know if there was anything that could be called in to help?     Pharmacy - [facility]

## 2015-08-14 NOTE — Telephone Encounter (Signed)
According to Dr. Danae OrleansBledsoe's notes she did test positive for strep and needs to complete the amoxicillin she was prescribed. Benedryl can be used for itching.

## 2015-08-14 NOTE — Telephone Encounter (Signed)
I left a message informing patient of positive strep and that she can go pick up her prescription and take benedryl.

## 2015-08-14 NOTE — Telephone Encounter (Signed)
Left message for Beth Aguilar that a prescription for triamcinolone has been sent to her pharmacy.

## 2015-08-14 NOTE — Telephone Encounter (Signed)
Please advise Beth Aguilar of lab results from this last weekend. It appears that there is a positive strep a culture? He called back to follow up on this.

## 2015-08-14 NOTE — Telephone Encounter (Signed)
-----   Message from Excell SeltzerAmy E Bedsole, MD sent at 08/14/2015  2:15 PM EST ----- Call in triamcinolone 0.1% cream apply to affected area twice daily for itching #30 gm 0 RF.

## 2015-08-14 NOTE — Telephone Encounter (Signed)
Please advise 

## 2015-08-14 NOTE — Telephone Encounter (Signed)
Left message for patient to call back  

## 2015-12-17 ENCOUNTER — Ambulatory Visit (INDEPENDENT_AMBULATORY_CARE_PROVIDER_SITE_OTHER): Payer: BC Managed Care – PPO | Admitting: Internal Medicine

## 2015-12-17 ENCOUNTER — Other Ambulatory Visit (INDEPENDENT_AMBULATORY_CARE_PROVIDER_SITE_OTHER): Payer: BC Managed Care – PPO

## 2015-12-17 ENCOUNTER — Encounter: Payer: Self-pay | Admitting: Internal Medicine

## 2015-12-17 VITALS — BP 120/72 | HR 72 | Temp 98.4°F | Resp 12 | Ht 60.0 in | Wt 130.0 lb

## 2015-12-17 DIAGNOSIS — R200 Anesthesia of skin: Secondary | ICD-10-CM

## 2015-12-17 DIAGNOSIS — R208 Other disturbances of skin sensation: Secondary | ICD-10-CM | POA: Diagnosis not present

## 2015-12-17 LAB — TSH: TSH: 1.25 u[IU]/mL (ref 0.35–4.50)

## 2015-12-17 LAB — VITAMIN B12: VITAMIN B 12: 339 pg/mL (ref 211–911)

## 2015-12-17 LAB — HEMOGLOBIN A1C: HEMOGLOBIN A1C: 5.6 % (ref 4.6–6.5)

## 2015-12-17 LAB — FOLATE: Folate: 19.2 ng/mL (ref >5.9)

## 2015-12-17 NOTE — Progress Notes (Signed)
Pre visit review using our clinic review tool, if applicable. No additional management support is needed unless otherwise documented below in the visit note. 

## 2015-12-17 NOTE — Patient Instructions (Signed)
We are checking the labs today and will call you back about the results.   Try a splint for carpal tunnel at night time for several weeks to see if it helps.   Carpal Tunnel Syndrome Carpal tunnel syndrome is a condition that causes pain in your hand and arm. The carpal tunnel is a narrow area located on the palm side of your wrist. Repeated wrist motion or certain diseases may cause swelling within the tunnel. This swelling pinches the main nerve in the wrist (median nerve). CAUSES  This condition may be caused by:   Repeated wrist motions.  Wrist injuries.  Arthritis.  A cyst or tumor in the carpal tunnel.  Fluid buildup during pregnancy. Sometimes the cause of this condition is not known.  RISK FACTORS This condition is more likely to develop in:   People who have jobs that cause them to repeatedly move their wrists in the same motion, such as butchers and cashiers.  Women.  People with certain conditions, such as:  Diabetes.  Obesity.  An underactive thyroid (hypothyroidism).  Kidney failure. SYMPTOMS  Symptoms of this condition include:   A tingling feeling in your fingers, especially in your thumb, index, and middle fingers.  Tingling or numbness in your hand.  An aching feeling in your entire arm, especially when your wrist and elbow are bent for long periods of time.  Wrist pain that goes up your arm to your shoulder.  Pain that goes down into your palm or fingers.  A weak feeling in your hands. You may have trouble grabbing and holding items. Your symptoms may feel worse during the night.  DIAGNOSIS  This condition is diagnosed with a medical history and physical exam. You may also have tests, including:   An electromyogram (EMG). This test measures electrical signals sent by your nerves into the muscles.  X-rays. TREATMENT  Treatment for this condition includes:  Lifestyle changes. It is important to stop doing or modify the activity that caused  your condition.  Physical or occupational therapy.  Medicines for pain and inflammation. This may include medicine that is injected into your wrist.  A wrist splint.  Surgery. HOME CARE INSTRUCTIONS  If You Have a Splint:  Wear it as told by your health care provider. Remove it only as told by your health care provider.  Loosen the splint if your fingers become numb and tingle, or if they turn cold and blue.  Keep the splint clean and dry. General Instructions  Take over-the-counter and prescription medicines only as told by your health care provider.  Rest your wrist from any activity that may be causing your pain. If your condition is work related, talk to your employer about changes that can be made, such as getting a wrist pad to use while typing.  If directed, apply ice to the painful area:  Put ice in a plastic bag.  Place a towel between your skin and the bag.  Leave the ice on for 20 minutes, 2-3 times per day.  Keep all follow-up visits as told by your health care provider. This is important.  Do any exercises as told by your health care provider, physical therapist, or occupational therapist. SEEK MEDICAL CARE IF:   You have new symptoms.  Your pain is not controlled with medicines.  Your symptoms get worse.   This information is not intended to replace advice given to you by your health care provider. Make sure you discuss any questions you have with  your health care provider.   Document Released: 09/05/2000 Document Revised: 05/30/2015 Document Reviewed: 01/24/2015 Elsevier Interactive Patient Education [employer]

## 2015-12-18 DIAGNOSIS — R200 Anesthesia of skin: Secondary | ICD-10-CM | POA: Insufficient documentation

## 2015-12-18 NOTE — Assessment & Plan Note (Signed)
We talked about the numbness in the hands and carpal tunnel as possibility. Checking TSH, HgA1c, B12, folate today. Advised splints at night time.

## 2015-12-18 NOTE — Progress Notes (Signed)
   Subjective:    Patient ID: Beth Aguilar, female    DOB: 1956/04/30, 60 y.o.   MRN: [id number]  HPI The patient is a 60 YO female coming in for numbness in her hands in the morning. She has not changed her sleeping position lately. 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 Some pain in the wrist during the day and rare numbness during the day. No weakness or dropping things. Both hands are numb in the morning but pain more in the right wrist. No other changes and no neck or back pain. No injury or overuse that she can think of. Going on for 2 months or so. Stable since that time. Has not tried any medicine for it.   Review of Systems  Constitutional: Negative for fever, activity change, appetite change, fatigue and unexpected weight change.  Respiratory: Negative for cough, chest tightness, shortness of breath and wheezing.   Cardiovascular: Negative for chest pain, palpitations and leg swelling.  Gastrointestinal: Negative for abdominal pain, diarrhea, constipation and abdominal distention.  Musculoskeletal: Negative.   Neurological: Positive for numbness.  Psychiatric/Behavioral: Negative.       Objective:   Physical Exam  Constitutional: She is oriented to person, place, and time. She appears well-developed and well-nourished.  HENT:  Head: Normocephalic and atraumatic.  Eyes: EOM are normal.  Neck: Normal range of motion.  Cardiovascular: Normal rate and regular rhythm.   No murmur heard. Pulmonary/Chest: Effort normal and breath sounds normal. No respiratory distress. She has no wheezes.  Abdominal: Soft.  Musculoskeletal: She exhibits no edema.  Some pain in the wrist right  Neurological: She is alert and oriented to person, place, and time. No cranial nerve deficit. Coordination normal.  Skin: Skin is warm and dry.  Psychiatric: She has a normal mood and affect. Her behavior is normal.   Filed Vitals:   12/17/15 1612  BP: 120/72  Pulse: 72  Temp: 98.4 F (36.9 C)    TempSrc: Oral  Resp: 12  Height: 5' (1.524 m)  Weight: 130 lb (58.968 kg)  SpO2: 98%      Assessment & Plan:

## 2016-01-28 ENCOUNTER — Other Ambulatory Visit: Payer: Self-pay | Admitting: Internal Medicine

## 2016-03-03 ENCOUNTER — Ambulatory Visit: Payer: BC Managed Care – PPO | Admitting: Family Medicine

## 2016-05-08 ENCOUNTER — Ambulatory Visit (INDEPENDENT_AMBULATORY_CARE_PROVIDER_SITE_OTHER): Payer: BC Managed Care – PPO | Admitting: Internal Medicine

## 2016-05-08 ENCOUNTER — Encounter: Payer: Self-pay | Admitting: Internal Medicine

## 2016-05-08 VITALS — BP 136/80 | HR 88 | Temp 98.3°F | Resp 14 | Ht 60.0 in | Wt 130.0 lb

## 2016-05-08 DIAGNOSIS — Z1159 Encounter for screening for other viral diseases: Secondary | ICD-10-CM | POA: Diagnosis not present

## 2016-05-08 DIAGNOSIS — E785 Hyperlipidemia, unspecified: Secondary | ICD-10-CM | POA: Diagnosis not present

## 2016-05-08 DIAGNOSIS — Z0000 Encounter for general adult medical examination without abnormal findings: Secondary | ICD-10-CM | POA: Diagnosis not present

## 2016-05-08 NOTE — Patient Instructions (Signed)
We will have you stop by the lab tomorrow morning. It opens at 7:30 AM and you do not need an appointment.   We will send the results on mychart and if the cholesterol is good we will decrease the dose to 10 mg lipitor.   Health Maintenance, Female Adopting a healthy lifestyle and getting preventive care can go a long way to promote health and wellness. Talk with your health care provider about what schedule of regular examinations is right for you. This is a good chance for you to check in with your provider about disease prevention and staying healthy. In between checkups, there are plenty of things you can do on your own. Experts have done a lot of research about which lifestyle changes and preventive measures are most likely to keep you healthy. Ask your health care provider for more information. WEIGHT AND DIET  Eat a healthy diet  Be sure to include plenty of vegetables, fruits, low-fat dairy products, and lean protein.  Do not eat a lot of foods high in solid fats, added sugars, or salt.  Get regular exercise. This is one of the most important things you can do for your health.  Most adults should exercise for at least 150 minutes each week. The exercise should increase your heart rate and make you sweat (moderate-intensity exercise).  Most adults should also do strengthening exercises at least twice a week. This is in addition to the moderate-intensity exercise.  Maintain a healthy weight  Body mass index (BMI) is a measurement that can be used to identify possible weight problems. It estimates body fat based on height and weight. Your health care provider can help determine your BMI and help you achieve or maintain a healthy weight.  For females 62 years of age and older:   A BMI below 18.5 is considered underweight.  A BMI of 18.5 to 24.9 is normal.  A BMI of 25 to 29.9 is considered overweight.  A BMI of 30 and above is considered obese.  Watch levels of cholesterol and  blood lipids  You should start having your blood tested for lipids and cholesterol at 59 years of age, then have this test every 5 years.  You may need to have your cholesterol levels checked more often if:  Your lipid or cholesterol levels are high.  You are older than 59 years of age.  You are at high risk for heart disease.  CANCER SCREENING   Lung Cancer  Lung cancer screening is recommended for adults 13-46 years old who are at high risk for lung cancer because of a history of smoking.  A yearly low-dose CT scan of the lungs is recommended for people who:  Currently smoke.  Have quit within the past 15 years.  Have at least a 30-pack-year history of smoking. A pack year is smoking an average of one pack of cigarettes a day for 1 year.  Yearly screening should continue until it has been 15 years since you quit.  Yearly screening should stop if you develop a health problem that would prevent you from having lung cancer treatment.  Breast Cancer  Practice breast self-awareness. This means understanding how your breasts normally appear and feel.  It also means doing regular breast self-exams. Let your health care provider know about any changes, no matter how small.  If you are in your 20s or 30s, you should have a clinical breast exam (CBE) by a health care provider every 1-3 years as part  of a regular health exam.  If you are 41 or older, have a CBE every year. Also consider having a breast X-ray (mammogram) every year.  If you have a family history of breast cancer, talk to your health care provider about genetic screening.  If you are at high risk for breast cancer, talk to your health care provider about having an MRI and a mammogram every year.  Breast cancer gene (BRCA) assessment is recommended for women who have family members with BRCA-related cancers. BRCA-related cancers include:  Breast.  Ovarian.  Tubal.  Peritoneal cancers.  Results of the  assessment will determine the need for genetic counseling and BRCA1 and BRCA2 testing. Cervical Cancer Your health care provider may recommend that you be screened regularly for cancer of the pelvic organs (ovaries, uterus, and vagina). This screening involves a pelvic examination, including checking for microscopic changes to the surface of your cervix (Pap test). You may be encouraged to have this screening done every 3 years, beginning at age 89.  For women ages 63-65, health care providers may recommend pelvic exams and Pap testing every 3 years, or they may recommend the Pap and pelvic exam, combined with testing for human papilloma virus (HPV), every 5 years. Some types of HPV increase your risk of cervical cancer. Testing for HPV may also be done on women of any age with unclear Pap test results.  Other health care providers may not recommend any screening for nonpregnant women who are considered low risk for pelvic cancer and who do not have symptoms. Ask your health care provider if a screening pelvic exam is right for you.  If you have had past treatment for cervical cancer or a condition that could lead to cancer, you need Pap tests and screening for cancer for at least 20 years after your treatment. If Pap tests have been discontinued, your risk factors (such as having a new sexual partner) need to be reassessed to determine if screening should resume. Some women have medical problems that increase the chance of getting cervical cancer. In these cases, your health care provider may recommend more frequent screening and Pap tests. Colorectal Cancer  This type of cancer can be detected and often prevented.  Routine colorectal cancer screening usually begins at 59 years of age and continues through 59 years of age.  Your health care provider may recommend screening at an earlier age if you have risk factors for colon cancer.  Your health care provider may also recommend using home test kits  to check for hidden blood in the stool.  A small camera at the end of a tube can be used to examine your colon directly (sigmoidoscopy or colonoscopy). This is done to check for the earliest forms of colorectal cancer.  Routine screening usually begins at age 24.  Direct examination of the colon should be repeated every 5-10 years through 59 years of age. However, you may need to be screened more often if early forms of precancerous polyps or small growths are found. Skin Cancer  Check your skin from head to toe regularly.  Tell your health care provider about any new moles or changes in moles, especially if there is a change in a mole's shape or color.  Also tell your health care provider if you have a mole that is larger than the size of a pencil eraser.  Always use sunscreen. Apply sunscreen liberally and repeatedly throughout the day.  Protect yourself by wearing long sleeves, pants, a  wide-brimmed hat, and sunglasses whenever you are outside. HEART DISEASE, DIABETES, AND HIGH BLOOD PRESSURE   High blood pressure causes heart disease and increases the risk of stroke. High blood pressure is more likely to develop in:  People who have blood pressure in the high end of the normal range (130-139/85-89 mm Hg).  People who are overweight or obese.  People who are African American.  If you are 5-68 years of age, have your blood pressure checked every 3-5 years. If you are 56 years of age or older, have your blood pressure checked every year. You should have your blood pressure measured twice--once when you are at a hospital or clinic, and once when you are not at a hospital or clinic. Record the average of the two measurements. To check your blood pressure when you are not at a hospital or clinic, you can use:  An automated blood pressure machine at a pharmacy.  A home blood pressure monitor.  If you are between 56 years and 22 years old, ask your health care provider if you should  take aspirin to prevent strokes.  Have regular diabetes screenings. This involves taking a blood sample to check your fasting blood sugar level.  If you are at a normal weight and have a low risk for diabetes, have this test once every three years after 59 years of age.  If you are overweight and have a high risk for diabetes, consider being tested at a younger age or more often. PREVENTING INFECTION  Hepatitis B  If you have a higher risk for hepatitis B, you should be screened for this virus. You are considered at high risk for hepatitis B if:  You were born in a country where hepatitis B is common. Ask your health care provider which countries are considered high risk.  Your parents were born in a high-risk country, and you have not been immunized against hepatitis B (hepatitis B vaccine).  You have HIV or AIDS.  You use needles to inject street drugs.  You live with someone who has hepatitis B.  You have had sex with someone who has hepatitis B.  You get hemodialysis treatment.  You take certain medicines for conditions, including cancer, organ transplantation, and autoimmune conditions. Hepatitis C  Blood testing is recommended for:  Everyone born from [birth year] through 1964.  Anyone with known risk factors for hepatitis C. Sexually transmitted infections (STIs)  You should be screened for sexually transmitted infections (STIs) including gonorrhea and chlamydia if:  You are sexually active and are younger than 59 years of age.  You are older than 59 years of age and your health care provider tells you that you are at risk for this type of infection.  Your sexual activity has changed since you were last screened and you are at an increased risk for chlamydia or gonorrhea. Ask your health care provider if you are at risk.  If you do not have HIV, but are at risk, it may be recommended that you take a prescription medicine daily to prevent HIV infection. This is called  pre-exposure prophylaxis (PrEP). You are considered at risk if:  You are sexually active and do not regularly use condoms or know the HIV status of your partner(s).  You take drugs by injection.  You are sexually active with a partner who has HIV. Talk with your health care provider about whether you are at high risk of being infected with HIV. If you choose to begin PrEP,  you should first be tested for HIV. You should then be tested every 3 months for as long as you are taking PrEP.  PREGNANCY   If you are premenopausal and you may become pregnant, ask your health care provider about preconception counseling.  If you may become pregnant, take 400 to 800 micrograms (mcg) of folic acid every day.  If you want to prevent pregnancy, talk to your health care provider about birth control (contraception). OSTEOPOROSIS AND MENOPAUSE   Osteoporosis is a disease in which the bones lose minerals and strength with aging. This can result in serious bone fractures. Your risk for osteoporosis can be identified using a bone density scan.  If you are 2 years of age or older, or if you are at risk for osteoporosis and fractures, ask your health care provider if you should be screened.  Ask your health care provider whether you should take a calcium or vitamin D supplement to lower your risk for osteoporosis.  Menopause may have certain physical symptoms and risks.  Hormone replacement therapy may reduce some of these symptoms and risks. Talk to your health care provider about whether hormone replacement therapy is right for you.  HOME CARE INSTRUCTIONS   Schedule regular health, dental, and eye exams.  Stay current with your immunizations.   Do not use any tobacco products including cigarettes, chewing tobacco, or electronic cigarettes.  If you are pregnant, do not drink alcohol.  If you are breastfeeding, limit how much and how often you drink alcohol.  Limit alcohol intake to no more than 1  drink per day for nonpregnant women. One drink equals 12 ounces of beer, 5 ounces of wine, or 1 ounces of hard liquor.  Do not use street drugs.  Do not share needles.  Ask your health care provider for help if you need support or information about quitting drugs.  Tell your health care provider if you often feel depressed.  Tell your health care provider if you have ever been abused or do not feel safe at home.   This information is not intended to replace advice given to you by your health care provider. Make sure you discuss any questions you have with your health care provider.   Document Released: 03/24/2011 Document Revised: 09/29/2014 Document Reviewed: 08/10/2013 Elsevier Interactive Patient Education [employer].

## 2016-05-08 NOTE — Progress Notes (Signed)
Pre visit review using our clinic review tool, if applicable. No additional management support is needed unless otherwise documented below in the visit note. 

## 2016-05-08 NOTE — Assessment & Plan Note (Addendum)
Colonoscopy up to date, declines flu and tetanus today. Checking labs today. Counseled on sun safety and mole surveillance. Counseled on the dangers of distracted driving. Checking hep c screening. Given screening recommendations.

## 2016-05-08 NOTE — Progress Notes (Signed)
   Subjective:    Patient ID: Beth Aguilar, female    DOB: Oct 27, 1955, 60 y.o.   MRN: [id number]  HPI The patient is a 60 YO female coming in for wellness. No new concerns.   [facility], social history reviewed and updated.  Review of Systems  Constitutional: Negative for activity change, appetite change, fatigue, fever and unexpected weight change.  HENT: Negative.   Eyes: Negative.   Respiratory: Negative for cough, chest tightness, shortness of breath and wheezing.   Cardiovascular: Negative for chest pain, palpitations and leg swelling.  Gastrointestinal: Negative for abdominal distention, abdominal pain, constipation and diarrhea.  Genitourinary: Negative.   Musculoskeletal: Negative.   Skin: Negative.   Neurological: Negative.   Psychiatric/Behavioral: Negative.       Objective:   Physical Exam  Constitutional: She is oriented to person, place, and time. She appears well-developed and well-nourished.  HENT:  Head: Normocephalic and atraumatic.  Right Ear: External ear normal.  Left Ear: External ear normal.  Eyes: EOM are normal.  Neck: Normal range of motion.  Cardiovascular: Normal rate and regular rhythm.   No murmur heard. Carotids without bruits.   Pulmonary/Chest: Effort normal and breath sounds normal. No respiratory distress. She has no wheezes.  Abdominal: Soft. Bowel sounds are normal. She exhibits no distension. There is no tenderness. There is no rebound.  Musculoskeletal: She exhibits no edema.  Neurological: She is alert and oriented to person, place, and time. Coordination normal.  Skin: Skin is warm and dry.  Psychiatric: She has a normal mood and affect.   Vitals:   05/08/16 1550  BP: 136/80  Pulse: 88  Resp: 14  Temp: 98.3 F (36.8 C)  TempSrc: Oral  SpO2: 97%  Weight: 130 lb (59 kg)  Height: 5' (1.524 m)      Assessment & Plan:

## 2016-05-08 NOTE — Assessment & Plan Note (Signed)
Taking simvastatin without side effects. Checking lipid panel and adjust as needed.

## 2016-05-09 ENCOUNTER — Other Ambulatory Visit (INDEPENDENT_AMBULATORY_CARE_PROVIDER_SITE_OTHER): Payer: BC Managed Care – PPO

## 2016-05-09 DIAGNOSIS — Z0000 Encounter for general adult medical examination without abnormal findings: Secondary | ICD-10-CM

## 2016-05-09 DIAGNOSIS — Z1159 Encounter for screening for other viral diseases: Secondary | ICD-10-CM

## 2016-05-09 LAB — COMPREHENSIVE METABOLIC PANEL
ALBUMIN: 4.4 g/dL (ref 3.5–5.2)
ALT: 16 U/L (ref 0–35)
AST: 20 U/L (ref 0–37)
Alkaline Phosphatase: 49 U/L (ref 39–117)
BILIRUBIN TOTAL: 0.5 mg/dL (ref 0.2–1.2)
BUN: 15 mg/dL (ref 6–23)
CALCIUM: 9.5 mg/dL (ref 8.4–10.5)
CHLORIDE: 103 meq/L (ref 96–112)
CO2: 30 mEq/L (ref 19–32)
Creatinine, Ser: 0.93 mg/dL (ref 0.40–1.20)
GFR: 65.51 mL/min (ref >60.00)
Glucose, Bld: 92 mg/dL (ref 70–99)
POTASSIUM: 4.2 meq/L (ref 3.5–5.1)
Sodium: 140 mEq/L (ref 135–145)
Total Protein: 7.1 g/dL (ref 6.0–8.3)

## 2016-05-09 LAB — LIPID PANEL
CHOL/HDL RATIO: 3
Cholesterol: 180 mg/dL (ref 0–200)
HDL: 58.2 mg/dL (ref >39.00)
LDL CALC: 102 mg/dL — AB (ref 0–99)
NONHDL: 121.55
Triglycerides: 98 mg/dL (ref 0.0–149.0)
VLDL: 19.6 mg/dL (ref 0.0–40.0)

## 2016-05-09 LAB — VITAMIN D 25 HYDROXY (VIT D DEFICIENCY, FRACTURES): VITD: 36.28 ng/mL (ref 30.00–100.00)

## 2016-05-09 LAB — CBC
HEMATOCRIT: 42 % (ref 36.0–46.0)
Hemoglobin: 14.3 g/dL (ref 12.0–15.0)
MCHC: 34 g/dL (ref 30.0–36.0)
MCV: 91.1 fl (ref 78.0–100.0)
Platelets: 201 10*3/uL (ref 150.0–400.0)
RBC: 4.61 Mil/uL (ref 3.87–5.11)
RDW: 13.4 % (ref 11.5–15.5)
WBC: 6.4 10*3/uL (ref 4.0–10.5)

## 2016-05-09 LAB — TSH: TSH: 1.77 u[IU]/mL (ref 0.35–4.50)

## 2016-05-09 LAB — VITAMIN B12: Vitamin B-12: 320 pg/mL (ref 211–911)

## 2016-05-09 LAB — HEPATITIS C ANTIBODY: HCV AB: NEGATIVE

## 2016-06-05 ENCOUNTER — Other Ambulatory Visit: Payer: Self-pay | Admitting: Internal Medicine

## 2016-06-05 DIAGNOSIS — J302 Other seasonal allergic rhinitis: Secondary | ICD-10-CM

## 2016-07-21 ENCOUNTER — Ambulatory Visit (INDEPENDENT_AMBULATORY_CARE_PROVIDER_SITE_OTHER): Payer: BC Managed Care – PPO | Admitting: Nurse Practitioner

## 2016-07-21 ENCOUNTER — Encounter: Payer: Self-pay | Admitting: Nurse Practitioner

## 2016-07-21 VITALS — BP 126/86 | HR 88 | Temp 97.6°F | Ht 60.0 in | Wt 130.0 lb

## 2016-07-21 DIAGNOSIS — H1013 Acute atopic conjunctivitis, bilateral: Secondary | ICD-10-CM | POA: Diagnosis not present

## 2016-07-21 MED ORDER — POLYETHYL GLYCOL-PROPYL GLYCOL 0.4-0.3 % OP SOLN: 1.0000 [drp] | OPHTHALMIC | 0 refills | Status: DC | PRN

## 2016-07-21 MED ORDER — OLOPATADINE HCL 0.2 % OP SOLN: 1.0000 [drp] | Freq: Two times a day (BID) | OPHTHALMIC | 0 refills | Status: DC

## 2016-07-21 NOTE — Progress Notes (Signed)
Subjective:  Patient ID: Beth Aguilar, female    DOB: [birth year]  Age: 60 y.o. MRN: [id number]  CC: Eye Problem (Pt stated eye oozing for about 2 weeks)   Eye Problem   Both eyes are affected.This is a new problem. The current episode started 1 to 4 weeks ago. The problem occurs constantly. The problem has been waxing and waning. There was no injury mechanism. The patient is experiencing no pain. There is no known exposure to pink eye. She does not wear contacts. Associated symptoms include eye redness, a foreign body sensation and itching. Pertinent negatives include no blurred vision, eye discharge, double vision, fever, nausea, photophobia, recent URI or vomiting. She has tried water for the symptoms. The treatment provided no relief.    Outpatient Medications Prior to Visit  Medication Sig Dispense Refill  . azelastine (ASTELIN) 0.1 % nasal spray PLACE 2 SPRAYS INTO EACH NOSTRIL TWO TIMES DAILY AS DIRECTED. 30 mL 5  . simvastatin (ZOCOR) 20 MG tablet TAKE 1 TABLET BY MOUTH DAILY. 90 tablet 3   No facility-administered medications prior to visit.     ROS See HPI  Objective:  BP 126/86 (BP Location: Left Arm, Patient Position: Sitting, Cuff Size: Normal)   Pulse 88   Temp 97.6 F (36.4 C)   Ht 5' (1.524 m)   Wt 130 lb (59 kg)   SpO2 97%   BMI 25.39 kg/m   BP Readings from Last 3 Encounters:  07/21/16 126/86  05/08/16 136/80  12/17/15 120/72    Wt Readings from Last 3 Encounters:  07/21/16 130 lb (59 kg)  05/08/16 130 lb (59 kg)  12/17/15 130 lb (59 kg)    Physical Exam  Constitutional: No distress.  Eyes: EOM are normal. Pupils are equal, round, and reactive to light. Right eye exhibits chemosis. Right eye exhibits no discharge and no hordeolum. Left eye exhibits chemosis. Left eye exhibits no discharge and no hordeolum. Right conjunctiva is not injected. Right conjunctiva has no hemorrhage. Left conjunctiva is not injected. Left conjunctiva has no hemorrhage. No  scleral icterus.  Neck: Normal range of motion. Neck supple.  Lymphadenopathy:    She has no cervical adenopathy.  Vitals reviewed.   Lab Results  Component Value Date   WBC 6.4 05/09/2016   HGB 14.3 05/09/2016   HCT 42.0 05/09/2016   PLT 201.0 05/09/2016   GLUCOSE 92 05/09/2016   CHOL 180 05/09/2016   TRIG 98.0 05/09/2016   HDL 58.20 05/09/2016   LDLCALC 102 (H) 05/09/2016   ALT 16 05/09/2016   AST 20 05/09/2016   NA 140 05/09/2016   K 4.2 05/09/2016   CL 103 05/09/2016   CREATININE 0.93 05/09/2016   BUN 15 05/09/2016   CO2 30 05/09/2016   TSH 1.77 05/09/2016   HGBA1C 5.6 12/17/2015    No results found.  Assessment & Plan:   Beth Aguilar was seen today for eye problem.  Diagnoses and all orders for this visit:  Allergic conjunctivitis of both eyes -     Olopatadine HCl 0.2 % SOLN; Apply 1 drop to eye 2 (two) times daily. -     Polyethyl Glycol-Propyl Glycol (SYSTANE) 0.4-0.3 % SOLN; Apply 1 drop to eye as needed.   I am having [name] start on Olopatadine HCl and Polyethyl Glycol-Propyl Glycol. I am also having her maintain her simvastatin and azelastine.  Meds ordered this encounter  Medications  . Olopatadine HCl 0.2 % SOLN    Sig: Apply 1 drop to  eye 2 (two) times daily.    Dispense:  2.5 mL    Refill:  0    Order Specific Question:   Supervising Provider    Answer:   Tresa GarterPLOTNIKOV, ALEKSEI V [[id number]]  . Polyethyl Glycol-Propyl Glycol (SYSTANE) 0.4-0.3 % SOLN    Sig: Apply 1 drop to eye as needed.    Dispense:  15 mL    Refill:  0    Order Specific Question:   Supervising Provider    Answer:   Tresa GarterPLOTNIKOV, ALEKSEI V [[id number]]   Follow-up: Return if symptoms worsen or fail to improve.  Alysia Penn[place]arlotte Aiden Helzer, NP

## 2016-07-21 NOTE — Patient Instructions (Signed)
Keratoconjunctivitis Sicca Keratoconjunctivitis sicca (dry eye) is dryness of the membranes surrounding the eye. It is caused by inadequate production of natural tears. The eyes must remain lubricated at all times. This creates a smooth surface of the cornea (clear covering at the front of the eye), which allows the eyelids to slide over the eye without causing soreness and irritation. A small amount of tears are constantly produced by the tear glands (lacrimal glands). These glands are located under the outside part of the upper eyelids. Dry eyes are often caused by decreased tear production. This condition is called aqueous tear-deficient dry eyes. The tear glands do not produce enough tears to keep the tissues surrounding the eye moist. This condition is common in postmenopausal women. Dry eyes may also be caused by an abnormality of how the tears are made. This can result in faster evaporation of the tears. It is called evaporative dry eyes. Although the tear glands produce enough tears, the rate of evaporation is so fast that the entire eye surface cannot be kept covered with a complete layer of tears. The condition can occur during certain activities or in unusually dry surroundings.  Sometimes dry eyes are symptoms of other diseases that can affect the eyes. Some examples are:   Rheumatoid arthritis.  Systemic lupus erythematosus (lupus). Chronic inflammatory disease, causing the body to attack its own tissue.  Sjogren syndrome. Dryness of the eyes and mouth, sometimes associated with a form of rheumatoid arthritis or lupus. SYMPTOMS  Symptoms of dry eyes include:  Irritation.  Itching.  Redness.  Burning and feeling as though there is something gritty in the eye. If the surface of the eye becomes damaged, sensitivity may increase, along with increased discomfort and sensitivity to bright light. Symptoms are made worse by activities with decreased blinking, such as staring at a  television, book, or computer screen.  Symptoms are also worse in dusty or smoky areas and in dry environments. Certain drugs can make symptoms worse, including antihistamines, tranquilizers, diuretics, antihypertensives (blood pressure medicine), and oral contraceptives. Symptoms tend to improve during cool, rainy, or foggy weather and in humid places, such as in the shower. DIAGNOSIS  Dry eyes are usually diagnosed by symptoms alone. Sometimes a Schirmer test is done, in which a strip of filter paper is placed at the edge of the eyelid. The amount of moisture bathing the eye is measured. This test is done by measuring how far the tears go up a strip of paper applied to the eye in a set amount of time. Your caregiver will use a special microscope to examine the surface of the eye and the cornea to see if there are signs of dryness. TREATMENT  Artificial tears (eye drops made with substances that simulate real tears) applied every few hours can generally control the problem. Lubricating ointments may help more severe cases. Avoiding dry, drafty environments and using humidifiers may also help. Minor surgery can be done to block the flow of tears into the nose, so that more tears are available to bathe the eyes. Patients with evaporative dry eyes may also benefit from treatment of the inflammation (redness and soreness) of the eyelids, which often occurs with the dry eyes. This treatment may include warm compresses, eyelid margin scrubs, or oral medicines. PROGNOSIS  Even with severe dry eyes, it is uncommon to lose vision. At times, it may become difficult to see. Rarely, scarring and ulcers may occur, and blood vessels can grow across the cornea. Scarring and blood  vessel growth can impair vision. In severe cases, the cornea may become damaged or infected. Ulcers or infections of the cornea are serious complications. PREVENTION  There is no way to prevent getting keratoconjunctivitis sicca. However,  complications can be prevented by keeping the eyes as moist as possible with artificial drops, as needed. SEEK IMMEDIATE MEDICAL CARE IF:   Your eye pain gets worse.  You develop a pus-like drainage from the eye.  The eye drops or medicines prescribed by your caregiver are not helping.  You have dry eyes and a sudden increase in discomfort or redness.  You have a sudden decrease in vision.  You have any other questions or concerns.   This information is not intended to replace advice given to you by your health care provider. Make sure you discuss any questions you have with your health care provider.   Document Released: 07/26/2004 Document Revised: 09/29/2014 Document Reviewed: 07/30/2009 Elsevier Interactive Patient Education [employer] Inc[employer]

## 2016-07-21 NOTE — Progress Notes (Signed)
Pre visit review using our clinic review tool, if applicable. No additional management support is needed unless otherwise documented below in the visit note. 

## 2016-09-01 ENCOUNTER — Other Ambulatory Visit: Payer: Self-pay | Admitting: Nurse Practitioner

## 2016-09-01 DIAGNOSIS — H1013 Acute atopic conjunctivitis, bilateral: Secondary | ICD-10-CM

## 2016-09-01 NOTE — Telephone Encounter (Signed)
Routing to [place], please advise thanks

## 2017-03-11 ENCOUNTER — Other Ambulatory Visit: Payer: Self-pay | Admitting: Internal Medicine

## 2017-03-11 ENCOUNTER — Other Ambulatory Visit (INDEPENDENT_AMBULATORY_CARE_PROVIDER_SITE_OTHER): Payer: BC Managed Care – PPO

## 2017-03-11 ENCOUNTER — Ambulatory Visit (INDEPENDENT_AMBULATORY_CARE_PROVIDER_SITE_OTHER): Payer: BC Managed Care – PPO | Admitting: Internal Medicine

## 2017-03-11 ENCOUNTER — Telehealth: Payer: Self-pay

## 2017-03-11 ENCOUNTER — Encounter: Payer: Self-pay | Admitting: Internal Medicine

## 2017-03-11 VITALS — Ht 60.0 in | Wt 130.0 lb

## 2017-03-11 DIAGNOSIS — E785 Hyperlipidemia, unspecified: Secondary | ICD-10-CM

## 2017-03-11 DIAGNOSIS — J302 Other seasonal allergic rhinitis: Secondary | ICD-10-CM

## 2017-03-11 DIAGNOSIS — L259 Unspecified contact dermatitis, unspecified cause: Secondary | ICD-10-CM | POA: Insufficient documentation

## 2017-03-11 LAB — LIPID PANEL
Cholesterol: 265 mg/dL — ABNORMAL HIGH (ref 0–200)
HDL: 51.4 mg/dL (ref >39.00)
LDL Cholesterol: 188 mg/dL — ABNORMAL HIGH (ref 0–99)
NONHDL: 213.8
Total CHOL/HDL Ratio: 5
Triglycerides: 131 mg/dL (ref 0.0–149.0)
VLDL: 26.2 mg/dL (ref 0.0–40.0)

## 2017-03-11 MED ORDER — ASPIRIN 81 MG PO TABS: 81.0000 mg | ORAL_TABLET | Freq: Every day | ORAL | 99 refills | Status: AC

## 2017-03-11 MED ORDER — TRIAMCINOLONE ACETONIDE 0.1 % EX CREA: 1.0000 "application " | TOPICAL_CREAM | Freq: Two times a day (BID) | CUTANEOUS | 0 refills | Status: DC

## 2017-03-11 MED ORDER — ROSUVASTATIN CALCIUM 10 MG PO TABS
10.0000 mg | ORAL_TABLET | Freq: Every day | ORAL | 3 refills | Status: DC
Start: 2017-03-11 — End: 2018-03-15

## 2017-03-11 MED ORDER — METHYLPREDNISOLONE ACETATE 80 MG/ML IJ SUSP
80.0000 mg | Freq: Once | INTRAMUSCULAR | Status: AC
  Administered 2017-03-11: 80 mg via INTRAMUSCULAR

## 2017-03-11 NOTE — Telephone Encounter (Signed)
-----   Message from Corwin LevinsJames W John, MD sent at 03/11/2017  5:21 PM EDT ----- Left message on MyChart, pt to cont same tx except  The test results show that your current treatment is OK, except the LDL cholesterol is severely high at 188, with the goal being less than.  I think you should stop the simvastatin as you have, then start generic Crestor 10 mg per day, and follow lower cholesterol diet.  I have placed lab orders for a lab repeat in 4 weeks if you would want to do this.    Shuna Tabor to please inform pt, I will do rx and orders for lab in 4 wks

## 2017-03-11 NOTE — Patient Instructions (Addendum)
You had the steroid shot today   Please take all new medication as prescribed  - the steroid cream  Please also start Aspirin 81 mg - 1 per day  Please continue all other medications as before, and refills have been done if requested.  Please have the pharmacy call with any other refills you may need.  Please keep your appointments with your specialists as you may have planned  Please go to the LAB in the Basement (turn left off the elevator) for the tests to be done today  You will be contacted by phone if any changes need to be made immediately.  Otherwise, you will receive a letter about your results with an explanation, but please check with MyChart first.  Please remember to sign up for MyChart if you have not done so, as this will be important to you in the future with finding out test results, communicating by private email, and scheduling acute appointments online when needed.

## 2017-03-11 NOTE — Progress Notes (Signed)
   Subjective:    Patient ID: Beth Aguilar, female    DOB: 1956-07-01, 61 y.o.   MRN: [id number]  HPI  Here to f/u as PCP out on medical leave;   Has been out of statin for one month, and lets me know there was some discussion of maybe taking a lower dose.  Father had extreme back and leg pain, and pain was better off his statin.  Pt states also having hip and back pain, which also seemed better within 2 days, located at the bilat lower back and hip areas  So she stopped previous statin,  Is willing to try another  Also with typical poison ivy type rash to the left jawline, right post hand and left dorsal foot.  Asks for depomedrol shot and steroid cream.  Also Does have several wks mild ongoing nasal allergy symptoms with clearish congestion, itch and sneezing, without fever, pain, ST, cough, swelling or wheezing. Past Medical History:  Diagnosis Date  . Kidney stone    Past Surgical History:  Procedure Laterality Date  . CESAREAN SECTION     x2  . JOINT REPLACEMENT    . TONSILLECTOMY      reports that she has never smoked. She has never used smokeless tobacco. She reports that she does not drink alcohol or use drugs. family history includes Arthritis in her mother; Depression in her mother; Heart disease in her mother; Hyperlipidemia in her mother; Hypertension in her mother; Stroke in her father. Allergies  Allergen Reactions  . Flonase [Fluticasone Propionate] Other (See Comments)    hearing  . Compazine [Prochlorperazine]   . Gadolinium Derivatives   . Sulfa Antibiotics    Current Outpatient Prescriptions on File Prior to Visit  Medication Sig Dispense Refill  . azelastine (ASTELIN) 0.1 % nasal spray PLACE 2 SPRAYS INTO EACH NOSTRIL TWO TIMES DAILY AS DIRECTED. 30 mL 5   No current facility-administered medications on file prior to visit.    Review of Systems  Constitutional: Negative for other unusual diaphoresis or sweats HENT: Negative for ear discharge or  swelling Eyes: Negative for other worsening visual disturbances Respiratory: Negative for stridor or other swelling  Gastrointestinal: Negative for worsening distension or other blood Genitourinary: Negative for retention or other urinary change Musculoskeletal: Negative for other MSK pain or swelling Skin: Negative for color change or other new lesions Neurological: Negative for worsening tremors and other numbness  Psychiatric/Behavioral: Negative for worsening agitation or other fatigue All other system neg per pt    Objective:   Physical Exam Ht 5' (1.524 m)   Wt 130 lb (59 kg)   BMI 25.39 kg/m  VS noted,  Constitutional: Pt appears in NAD HENT: Head: NCAT.  Right Ear: External ear normal.  Left Ear: External ear normal.  Eyes: . Pupils are equal, round, and reactive to light. Conjunctivae and EOM are normal Nose: without d/c or deformity Neck: Neck supple. Gross normal ROM Cardiovascular: Normal rate and regular rhythm.   Pulmonary/Chest: Effort normal and breath sounds without rales or wheezing.  Neurological: Pt is alert. At baseline orientation, motor grossly intact Skin: Skin is warm. + typical contact derm like rashes extensive to both arms below the elbows, no other new lesions, no LE edema Psychiatric: Pt behavior is normal without agitation  No other exam findings       Assessment & Plan:

## 2017-03-11 NOTE — Telephone Encounter (Signed)
Called pt, LVM.   

## 2017-03-12 ENCOUNTER — Telehealth: Payer: Self-pay | Admitting: Internal Medicine

## 2017-03-12 NOTE — Telephone Encounter (Signed)
Pt returning your call stating she got her results on Mychart

## 2017-03-12 NOTE — Telephone Encounter (Signed)
Called pt back to see if she has any questions after seeing results on mychart and she did not. She is aware to come back to the lab in 4 weeks for repeat testing.

## 2017-03-14 NOTE — Assessment & Plan Note (Signed)
Mild to mod, also to improve with steroid tx as above,  to f/u any worsening symptoms or concerns

## 2017-03-14 NOTE — Assessment & Plan Note (Signed)
For lower chol diet, check lipids, for statin for goal LDl < 100 if indicated

## 2017-03-14 NOTE — Assessment & Plan Note (Signed)
Mild to mod, for depomedrol IM 80, steroid cream prn,  to f/u any worsening symptoms or concerns

## 2017-03-27 ENCOUNTER — Other Ambulatory Visit: Payer: Self-pay | Admitting: Internal Medicine

## 2017-03-27 DIAGNOSIS — J302 Other seasonal allergic rhinitis: Secondary | ICD-10-CM

## 2017-05-04 LAB — HM DEXA SCAN
HM DEXA SCAN: -2.4
HM Dexa Scan: -2.4

## 2017-05-05 ENCOUNTER — Other Ambulatory Visit (INDEPENDENT_AMBULATORY_CARE_PROVIDER_SITE_OTHER): Payer: BC Managed Care – PPO

## 2017-05-05 DIAGNOSIS — E785 Hyperlipidemia, unspecified: Secondary | ICD-10-CM | POA: Diagnosis not present

## 2017-05-05 LAB — LIPID PANEL
CHOL/HDL RATIO: 3
Cholesterol: 156 mg/dL (ref 0–200)
HDL: 50.8 mg/dL (ref >39.00)
LDL Cholesterol: 89 mg/dL (ref 0–99)
NONHDL: 104.88
TRIGLYCERIDES: 79 mg/dL (ref 0.0–149.0)
VLDL: 15.8 mg/dL (ref 0.0–40.0)

## 2017-05-05 LAB — HEPATIC FUNCTION PANEL
ALK PHOS: 49 U/L (ref 39–117)
ALT: 15 U/L (ref 0–35)
AST: 19 U/L (ref 0–37)
Albumin: 4.5 g/dL (ref 3.5–5.2)
BILIRUBIN DIRECT: 0.1 mg/dL (ref 0.0–0.3)
TOTAL PROTEIN: 6.7 g/dL (ref 6.0–8.3)
Total Bilirubin: 0.4 mg/dL (ref 0.2–1.2)

## 2017-05-12 ENCOUNTER — Encounter: Payer: Self-pay | Admitting: Internal Medicine

## 2017-05-12 NOTE — Progress Notes (Signed)
Abstracted and sent to scan  

## 2017-06-10 ENCOUNTER — Encounter: Payer: Self-pay | Admitting: Internal Medicine

## 2017-06-10 ENCOUNTER — Ambulatory Visit (INDEPENDENT_AMBULATORY_CARE_PROVIDER_SITE_OTHER): Payer: BC Managed Care – PPO | Admitting: Internal Medicine

## 2017-06-10 DIAGNOSIS — M8580 Other specified disorders of bone density and structure, unspecified site: Secondary | ICD-10-CM

## 2017-06-10 NOTE — Assessment & Plan Note (Signed)
With strong family history of fracture her risk justifies treatment. Advised that typically we wait 1-2 years after treatment to recheck bone density and this is likely too soon to see bone growth. She does weight bearing activities and takes calcium and vitamin D. Will await DEXA next year and if no improvement can switch to prolia.

## 2017-06-10 NOTE — Progress Notes (Signed)
Abstracted and sent to scan  

## 2017-06-10 NOTE — Progress Notes (Signed)
   Subjective:    Patient ID: Beth Aguilar, female    DOB: 03-24-1956, 61 y.o.   MRN: [id number]  HPI The patient is a 61 YO female coming in for follow up of bone density test. She has strong family history of osteoporosis in her mother with serious fracture. Started taking fosamax about 8 months ago and had DEXA 1 month ago with some lower numbers and she is concerned it is not working.   Review of Systems  Constitutional: Negative.   Respiratory: Negative.   Cardiovascular: Negative.   Gastrointestinal: Negative.   Musculoskeletal: Negative.   Skin: Negative.   Neurological: Negative.       Objective:   Physical Exam  Constitutional: She is oriented to person, place, and time. She appears well-developed and well-nourished.  HENT:  Head: Normocephalic and atraumatic.  Eyes: EOM are normal.  Neck: Normal range of motion.  Cardiovascular: Normal rate and regular rhythm.   Pulmonary/Chest: Effort normal and breath sounds normal. No respiratory distress. She has no wheezes. She has no rales.  Abdominal: Soft.  Musculoskeletal: She exhibits no edema.  Neurological: She is alert and oriented to person, place, and time.  Skin: Skin is warm and dry.   Vitals:   06/10/17 0944  BP: (!) 142/88  Pulse: 72  Temp: 98.3 F (36.8 C)  TempSrc: Oral  SpO2: 100%  Weight: 130 lb (59 kg)  Height: 5' (1.524 m)      Assessment & Plan:

## 2017-10-01 ENCOUNTER — Ambulatory Visit: Payer: BC Managed Care – PPO | Admitting: Family

## 2017-10-01 ENCOUNTER — Encounter: Payer: Self-pay | Admitting: Family

## 2017-10-01 VITALS — BP 132/72 | HR 75 | Temp 98.4°F | Ht 60.0 in | Wt 132.0 lb

## 2017-10-01 DIAGNOSIS — R1012 Left upper quadrant pain: Secondary | ICD-10-CM

## 2017-10-01 DIAGNOSIS — J019 Acute sinusitis, unspecified: Secondary | ICD-10-CM

## 2017-10-01 DIAGNOSIS — J040 Acute laryngitis: Secondary | ICD-10-CM | POA: Diagnosis not present

## 2017-10-01 DIAGNOSIS — M7918 Myalgia, other site: Secondary | ICD-10-CM

## 2017-10-01 MED ORDER — AZITHROMYCIN 250 MG PO TABS: ORAL_TABLET | ORAL | 0 refills | Status: DC

## 2017-10-01 NOTE — Patient Instructions (Addendum)
Consider trying Claritin 10 mg ( cut in 1/2) for antihistamine;

## 2017-10-01 NOTE — Progress Notes (Signed)
Beth Aguilar is a 62 y.o. female with the following history as recorded in Epic[facility]:  Patient Active Problem List   Diagnosis Date Noted  . Osteopenia 06/10/2017  . Contact dermatitis 03/11/2017  . Numbness in both hands 12/18/2015  . Other seasonal allergic rhinitis 05/08/2015  . Hyperlipidemia 11/04/2014  . Routine general medical examination at a health care facility 11/04/2014    Current Outpatient Medications  Medication Sig Dispense Refill  . alendronate (FOSAMAX) 10 MG tablet Take 10 mg by mouth daily before breakfast. Take with a full glass of water on an empty stomach.    Marland Kitchen aspirin 81 MG tablet Take 1 tablet (81 mg total) by mouth daily. 100 tablet 99  . azelastine (ASTELIN) 0.1 % nasal spray PLACE 2 SPRAYS INTO EACH NOSTRIL TWO TIMES DAILY AS DIRECTED. 30 mL 2  . rosuvastatin (CRESTOR) 10 MG tablet Take 1 tablet (10 mg total) by mouth daily. 90 tablet 3  . azithromycin (ZITHROMAX) 250 MG tablet 2 tabs po qd x 1 day; 1 tablet per day x 4 days; 6 tablet 0   No current facility-administered medications for this visit.     Allergies: Flonase [fluticasone propionate]; Compazine [prochlorperazine]; Gadolinium derivatives; and Sulfa antibiotics  Past Medical History:  Diagnosis Date  . Kidney stone     Past Surgical History:  Procedure Laterality Date  . CESAREAN SECTION     x2  . JOINT REPLACEMENT    . TONSILLECTOMY      Family History  Problem Relation Age of Onset  . Depression Mother   . Arthritis Mother   . Heart disease Mother   . Hypertension Mother   . Hyperlipidemia Mother   . Stroke Father     Social History   Tobacco Use  . Smoking status: Never Smoker  . Smokeless tobacco: Never Used  Substance Use Topics  . 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; currently on Astelin;   Also mentions occasional episode of feeling "twisting sharp" sensation in her upper left abdomen; notes that symptoms have been happening for at least the past 1-2 years; may occur twice a year; feels like something "catches" and then "lets go." Denies any chest pain on exertion or changes in bowel habits or abdominal pain;   Objective:  Vitals:   10/01/17 1535  BP: 132/72  Pulse: 75  Temp: 98.4 F (36.9 C)  TempSrc: Oral  SpO2: 98%  Weight: 132 lb 0.6 oz (59.9 kg)  Height: 5' (1.524 m)    General: Well developed, well nourished, in no acute distress  Skin : Warm and dry.  Head: Normocephalic and atraumatic  Eyes: Sclera and conjunctiva clear; pupils round and reactive to light; extraocular movements intact  Ears: External normal; canals clear; tympanic membranes normal  Oropharynx: Pink, supple. No suspicious lesions  Neck: Supple without thyromegaly, adenopathy  Lungs: Respirations unlabored; clear to auscultation bilaterally without wheeze, rales, rhonchi  CVS exam: normal rate and regular rhythm.  Abdomen: Soft; nontender; nondistended; normoactive bowel sounds; no masses or hepatosplenomegaly  Musculoskeletal: No deformities; no active joint inflammation  Extremities: No edema, cyanosis, clubbing  Vessels: Symmetric bilaterally  Neurologic: Alert and oriented; speech intact; face symmetrical; moves all extremities well; CNII-XII intact without focal deficit  Assessment:  1. Acute  sinusitis, recurrence not specified, unspecified location   2. Laryngitis   3. Muscular abdominal pain in left upper quadrant     Plan:  Rx for Z-pak #1 take as directed; patient will continue Astelin; consider Claritin; she does not do well on steroids; increase fluids, rest voice as much as possible; follow-up worse, no better.  Discussed imaging of abdomen but agree that will most likely be of low yield; encouraged core strengthening exercise  such as yoga/ pilates; if symptoms persist, worsen, she needs to talk to her PCP.   No Follow-up on file.  No orders of the defined types were placed in this encounter.   Requested Prescriptions   Signed Prescriptions Disp Refills  . azithromycin (ZITHROMAX) 250 MG tablet 6 tablet 0    Sig: 2 tabs po qd x 1 day; 1 tablet per day x 4 days;

## 2018-01-04 ENCOUNTER — Other Ambulatory Visit: Payer: Self-pay | Admitting: Internal Medicine

## 2018-01-04 DIAGNOSIS — J302 Other seasonal allergic rhinitis: Secondary | ICD-10-CM

## 2018-02-25 ENCOUNTER — Encounter: Payer: Self-pay | Admitting: Family

## 2018-02-25 ENCOUNTER — Ambulatory Visit: Payer: BC Managed Care – PPO | Admitting: Family

## 2018-02-25 VITALS — BP 122/76 | HR 87 | Temp 97.7°F | Ht 60.0 in | Wt 131.0 lb

## 2018-02-25 DIAGNOSIS — L255 Unspecified contact dermatitis due to plants, except food: Secondary | ICD-10-CM

## 2018-02-25 MED ORDER — TRIAMCINOLONE ACETONIDE 0.1 % EX CREA: 1.0000 "application " | TOPICAL_CREAM | Freq: Two times a day (BID) | CUTANEOUS | 0 refills | Status: DC

## 2018-02-25 MED ORDER — METHYLPREDNISOLONE ACETATE 80 MG/ML IJ SUSP
80.0000 mg | Freq: Once | INTRAMUSCULAR | Status: AC
  Administered 2018-02-25: 80 mg via INTRAMUSCULAR

## 2018-02-25 NOTE — Progress Notes (Signed)
  Beth Aguilar is a 62 y.o. female with the following history as recorded in Epic[facility]:  Patient Active Problem List   Diagnosis Date Noted  . Osteopenia 06/10/2017  . Contact dermatitis 03/11/2017  . Numbness in both hands 12/18/2015  . Other seasonal allergic rhinitis 05/08/2015  . Hyperlipidemia 11/04/2014  . Routine general medical examination at a health care facility 11/04/2014    Current Outpatient Medications  Medication Sig Dispense Refill  . aspirin 81 MG tablet Take 1 tablet (81 mg total) by mouth daily. 100 tablet 99  . azelastine (ASTELIN) 0.1 % nasal spray PLACE 2 SPRAYS INTO EACH NOSTRIL 2 TIMES DAILY AS DIRECTED. 30 mL 2  . rosuvastatin (CRESTOR) 10 MG tablet Take 1 tablet (10 mg total) by mouth daily. 90 tablet 3  . triamcinolone cream (KENALOG) 0.1 % Apply 1 application topically 2 (two) times daily. 30 g 0   No current facility-administered medications for this visit.     Allergies: Flonase [fluticasone propionate]; Compazine [prochlorperazine]; Gadolinium derivatives; and Sulfa antibiotics  Past Medical History:  Diagnosis Date  . Kidney stone     Past Surgical History:  Procedure Laterality Date  . CESAREAN SECTION     x2  . JOINT REPLACEMENT    . TONSILLECTOMY      Family History  Problem Relation Age of Onset  . Depression Mother   . Arthritis Mother   . Heart disease Mother   . Hypertension Mother   . Hyperlipidemia Mother   . Stroke Father     Social History   Tobacco Use  . Smoking status: Never Smoker  . Smokeless tobacco: Never Used  Substance Use Topics  . Alcohol use: No    Subjective:  Presents with concerns for exposure to poison ivy/ persisting rash; will be traveling to [place] next week and worried about complications; asking if she can get steroid shot as this is only medication that has been beneficial in the past;   Objective:  Vitals:   02/25/18 1607  BP: 122/76  Pulse: 87  Temp: 97.7 F (36.5 C)  TempSrc: Oral  SpO2:  97%  Weight: 131 lb (59.4 kg)  Height: 5' (1.524 m)    General: Well developed, well nourished, in no acute distress  Skin : Warm and dry. Scattered erythematous lesions on hands/ face c/w contact dermatitis Head: Normocephalic and atraumatic  Eyes: Sclera and conjunctiva clear; pupils round and reactive to light; extraocular movements intact  Ears: External normal; canals clear; tympanic membranes normal  Oropharynx: Pink, supple. No suspicious lesions  Neck: Supple without thyromegaly, adenopathy  Lungs: Respirations unlabored; clear to auscultation bilaterally without wheeze, rales, rhonchi  Neurologic: Alert and oriented; speech intact; face symmetrical; moves all extremities well; CNII-XII intact without focal deficit   Assessment:  1. Contact dermatitis due to plants, except food, unspecified contact dermatitis type     Plan:  Depo-Medrol IM 80 given; refill also given on Triamcinolone cream to use as needed;  Return for schedule CPE with Dr. Okey Duprerawford.  No orders of the defined types were placed in this encounter.   Requested Prescriptions   Signed Prescriptions Disp Refills  . triamcinolone cream (KENALOG) 0.1 % 30 g 0    Sig: Apply 1 application topically 2 (two) times daily.

## 2018-03-15 ENCOUNTER — Other Ambulatory Visit: Payer: Self-pay | Admitting: Internal Medicine

## 2018-04-02 LAB — HM MAMMOGRAPHY

## 2018-04-02 LAB — HM PAP SMEAR

## 2018-05-10 ENCOUNTER — Encounter: Payer: BC Managed Care – PPO | Admitting: Internal Medicine

## 2018-05-14 ENCOUNTER — Other Ambulatory Visit (INDEPENDENT_AMBULATORY_CARE_PROVIDER_SITE_OTHER): Payer: BC Managed Care – PPO

## 2018-05-14 ENCOUNTER — Encounter: Payer: Self-pay | Admitting: Internal Medicine

## 2018-05-14 ENCOUNTER — Ambulatory Visit (INDEPENDENT_AMBULATORY_CARE_PROVIDER_SITE_OTHER): Payer: BC Managed Care – PPO | Admitting: Internal Medicine

## 2018-05-14 VITALS — BP 108/78 | HR 81 | Temp 97.5°F | Ht 60.0 in | Wt 130.0 lb

## 2018-05-14 DIAGNOSIS — Z0000 Encounter for general adult medical examination without abnormal findings: Secondary | ICD-10-CM

## 2018-05-14 DIAGNOSIS — M8580 Other specified disorders of bone density and structure, unspecified site: Secondary | ICD-10-CM

## 2018-05-14 DIAGNOSIS — E785 Hyperlipidemia, unspecified: Secondary | ICD-10-CM | POA: Diagnosis not present

## 2018-05-14 LAB — CBC
HEMATOCRIT: 41.6 % (ref 36.0–46.0)
HEMOGLOBIN: 14.2 g/dL (ref 12.0–15.0)
MCHC: 34.2 g/dL (ref 30.0–36.0)
MCV: 91.6 fl (ref 78.0–100.0)
Platelets: 193 10*3/uL (ref 150.0–400.0)
RBC: 4.54 Mil/uL (ref 3.87–5.11)
RDW: 12.8 % (ref 11.5–15.5)
WBC: 6.9 10*3/uL (ref 4.0–10.5)

## 2018-05-14 LAB — COMPREHENSIVE METABOLIC PANEL
ALBUMIN: 4.7 g/dL (ref 3.5–5.2)
ALK PHOS: 51 U/L (ref 39–117)
ALT: 17 U/L (ref 0–35)
AST: 20 U/L (ref 0–37)
BUN: 20 mg/dL (ref 6–23)
CHLORIDE: 99 meq/L (ref 96–112)
CO2: 28 mEq/L (ref 19–32)
Calcium: 9.8 mg/dL (ref 8.4–10.5)
Creatinine, Ser: 0.86 mg/dL (ref 0.40–1.20)
GFR: 71.22 mL/min (ref >60.00)
Glucose, Bld: 105 mg/dL — ABNORMAL HIGH (ref 70–99)
POTASSIUM: 4.4 meq/L (ref 3.5–5.1)
Sodium: 135 mEq/L (ref 135–145)
TOTAL PROTEIN: 7.6 g/dL (ref 6.0–8.3)
Total Bilirubin: 0.7 mg/dL (ref 0.2–1.2)

## 2018-05-14 LAB — LIPID PANEL
CHOLESTEROL: 149 mg/dL (ref 0–200)
HDL: 51.5 mg/dL (ref >39.00)
LDL Cholesterol: 77 mg/dL (ref 0–99)
NonHDL: 97.89
TRIGLYCERIDES: 106 mg/dL (ref 0.0–149.0)
Total CHOL/HDL Ratio: 3
VLDL: 21.2 mg/dL (ref 0.0–40.0)

## 2018-05-14 MED ORDER — IBANDRONATE SODIUM 150 MG PO TABS: 150.0000 mg | ORAL_TABLET | ORAL | 4 refills | Status: DC

## 2018-05-14 MED ORDER — NYSTATIN-TRIAMCINOLONE 100000-0.1 UNIT/GM-% EX OINT: 1.0000 "application " | TOPICAL_OINTMENT | Freq: Two times a day (BID) | CUTANEOUS | 0 refills | Status: DC

## 2018-05-14 NOTE — Assessment & Plan Note (Signed)
Rx for boniva to try as she has not been able to tolerate fosamax. Due for dexa 2020. If worsening will transition to prolia.

## 2018-05-14 NOTE — Assessment & Plan Note (Signed)
Checking lipid panel and adjust crestor 10 mg daily as needed. 

## 2018-05-14 NOTE — Progress Notes (Signed)
   Subjective:    Patient ID: Beth Aguilar, female    DOB: [birth year], 62 y.o.   MRN: [id number]  HPI The patient is a 62 YO female coming in for physical. Stopped taking fosamax several months ago as it was bothering her stomach quite a bit. She does have strong family history of osteoporosis and mom died with this.    [facility], social history reviewed and updated.   Review of Systems  Constitutional: Negative.   HENT: Negative.   Eyes: Negative.   Respiratory: Negative for cough, chest tightness and shortness of breath.   Cardiovascular: Negative for chest pain, palpitations and leg swelling.  Gastrointestinal: Negative for abdominal distention, abdominal pain, constipation, diarrhea, nausea and vomiting.  Musculoskeletal: Negative.   Skin: Negative.   Neurological: Negative.   Psychiatric/Behavioral: Negative.       Objective:   Physical Exam  Constitutional: She is oriented to person, place, and time. She appears well-developed and well-nourished.  HENT:  Head: Normocephalic and atraumatic.  Eyes: EOM are normal.  Neck: Normal range of motion.  Cardiovascular: Normal rate and regular rhythm.  Pulmonary/Chest: Effort normal and breath sounds normal. No respiratory distress. She has no wheezes. She has no rales.  Abdominal: Soft. Bowel sounds are normal. She exhibits no distension. There is no tenderness. There is no rebound.  Musculoskeletal: She exhibits no edema.  Neurological: She is alert and oriented to person, place, and time. Coordination normal.  Skin: Skin is warm and dry.  Psychiatric: She has a normal mood and affect.   Vitals:   05/14/18 0818  BP: 108/78  Pulse: 81  Temp: (!) 97.5 F (36.4 C)  TempSrc: Oral  SpO2: 95%  Weight: 130 lb (59 kg)  Height: 5' (1.524 m)      Assessment & Plan:

## 2018-05-14 NOTE — Patient Instructions (Addendum)
Think about getting the shingles vaccine (shingrix) and call the office if you   We have sent in boniva which is 1 pill a month for the bone density.   Health Maintenance, Female Adopting a healthy lifestyle and getting preventive care can go a long way to promote health and wellness. Talk with your health care provider about what schedule of regular examinations is right for you. This is a good chance for you to check in with your provider about disease prevention and staying healthy. In between checkups, there are plenty of things you can do on your own. Experts have done a lot of research about which lifestyle changes and preventive measures are most likely to keep you healthy. Ask your health care provider for more information. Weight and diet Eat a healthy diet  Be sure to include plenty of vegetables, fruits, low-fat dairy products, and lean protein.  Do not eat a lot of foods high in solid fats, added sugars, or salt.  Get regular exercise. This is one of the most important things you can do for your health. ? Most adults should exercise for at least 150 minutes each week. The exercise should increase your heart rate and make you sweat (moderate-intensity exercise). ? Most adults should also do strengthening exercises at least twice a week. This is in addition to the moderate-intensity exercise.  Maintain a healthy weight  Body mass index (BMI) is a measurement that can be used to identify possible weight problems. It estimates body fat based on height and weight. Your health care provider can help determine your BMI and help you achieve or maintain a healthy weight.  For females 58 years of age and older: ? A BMI below 18.5 is considered underweight. ? A BMI of 18.5 to 24.9 is normal. ? A BMI of 25 to 29.9 is considered overweight. ? A BMI of 30 and above is considered obese.  Watch levels of cholesterol and blood lipids  You should start having your blood tested for lipids and  cholesterol at 61 years of age, then have this test every 5 years.  You may need to have your cholesterol levels checked more often if: ? Your lipid or cholesterol levels are high. ? You are older than 61 years of age. ? You are at high risk for heart disease.  Cancer screening Lung Cancer  Lung cancer screening is recommended for adults 79-25 years old who are at high risk for lung cancer because of a history of smoking.  A yearly low-dose CT scan of the lungs is recommended for people who: ? Currently smoke. ? Have quit within the past 15 years. ? Have at least a 30-pack-year history of smoking. A pack year is smoking an average of one pack of cigarettes a day for 1 year.  Yearly screening should continue until it has been 15 years since you quit.  Yearly screening should stop if you develop a health problem that would prevent you from having lung cancer treatment.  Breast Cancer  Practice breast self-awareness. This means understanding how your breasts normally appear and feel.  It also means doing regular breast self-exams. Let your health care provider know about any changes, no matter how small.  If you are in your 20s or 30s, you should have a clinical breast exam (CBE) by a health care provider every 1-3 years as part of a regular health exam.  If you are 93 or older, have a CBE every year. Also consider having  a breast X-ray (mammogram) every year.  If you have a family history of breast cancer, talk to your health care provider about genetic screening.  If you are at high risk for breast cancer, talk to your health care provider about having an MRI and a mammogram every year.  Breast cancer gene (BRCA) assessment is recommended for women who have family members with BRCA-related cancers. BRCA-related cancers include: ? Breast. ? Ovarian. ? Tubal. ? Peritoneal cancers.  Results of the assessment will determine the need for genetic counseling and BRCA1 and BRCA2  testing.  Cervical Cancer Your health care provider may recommend that you be screened regularly for cancer of the pelvic organs (ovaries, uterus, and vagina). This screening involves a pelvic examination, including checking for microscopic changes to the surface of your cervix (Pap test). You may be encouraged to have this screening done every 3 years, beginning at age 64.  For women ages 77-65, health care providers may recommend pelvic exams and Pap testing every 3 years, or they may recommend the Pap and pelvic exam, combined with testing for human papilloma virus (HPV), every 5 years. Some types of HPV increase your risk of cervical cancer. Testing for HPV may also be done on women of any age with unclear Pap test results.  Other health care providers may not recommend any screening for nonpregnant women who are considered low risk for pelvic cancer and who do not have symptoms. Ask your health care provider if a screening pelvic exam is right for you.  If you have had past treatment for cervical cancer or a condition that could lead to cancer, you need Pap tests and screening for cancer for at least 20 years after your treatment. If Pap tests have been discontinued, your risk factors (such as having a new sexual partner) need to be reassessed to determine if screening should resume. Some women have medical problems that increase the chance of getting cervical cancer. In these cases, your health care provider may recommend more frequent screening and Pap tests.  Colorectal Cancer  This type of cancer can be detected and often prevented.  Routine colorectal cancer screening usually begins at 61 years of age and continues through 61 years of age.  Your health care provider may recommend screening at an earlier age if you have risk factors for colon cancer.  Your health care provider may also recommend using home test kits to check for hidden blood in the stool.  A small camera at the end of a  tube can be used to examine your colon directly (sigmoidoscopy or colonoscopy). This is done to check for the earliest forms of colorectal cancer.  Routine screening usually begins at age 75.  Direct examination of the colon should be repeated every 5-10 years through 61 years of age. However, you may need to be screened more often if early forms of precancerous polyps or small growths are found.  Skin Cancer  Check your skin from head to toe regularly.  Tell your health care provider about any new moles or changes in moles, especially if there is a change in a mole's shape or color.  Also tell your health care provider if you have a mole that is larger than the size of a pencil eraser.  Always use sunscreen. Apply sunscreen liberally and repeatedly throughout the day.  Protect yourself by wearing long sleeves, pants, a wide-brimmed hat, and sunglasses whenever you are outside.  Heart disease, diabetes, and high blood pressure  High blood pressure causes heart disease and increases the risk of stroke. High blood pressure is more likely to develop in: ? People who have blood pressure in the high end of the normal range (130-139/85-89 mm Hg). ? People who are overweight or obese. ? People who are African American.  If you are 97-43 years of age, have your blood pressure checked every 3-5 years. If you are 34 years of age or older, have your blood pressure checked every year. You should have your blood pressure measured twice-once when you are at a hospital or clinic, and once when you are not at a hospital or clinic. Record the average of the two measurements. To check your blood pressure when you are not at a hospital or clinic, you can use: ? An automated blood pressure machine at a pharmacy. ? A home blood pressure monitor.  If you are between 39 years and 85 years old, ask your health care provider if you should take aspirin to prevent strokes.  Have regular diabetes screenings. This  involves taking a blood sample to check your fasting blood sugar level. ? If you are at a normal weight and have a low risk for diabetes, have this test once every three years after 61 years of age. ? If you are overweight and have a high risk for diabetes, consider being tested at a younger age or more often. Preventing infection Hepatitis B  If you have a higher risk for hepatitis B, you should be screened for this virus. You are considered at high risk for hepatitis B if: ? You were born in a country where hepatitis B is common. Ask your health care provider which countries are considered high risk. ? Your parents were born in a high-risk country, and you have not been immunized against hepatitis B (hepatitis B vaccine). ? You have HIV or AIDS. ? You use needles to inject street drugs. ? You live with someone who has hepatitis B. ? You have had sex with someone who has hepatitis B. ? You get hemodialysis treatment. ? You take certain medicines for conditions, including cancer, organ transplantation, and autoimmune conditions.  Hepatitis C  Blood testing is recommended for: ? Everyone born from [birth year] through 1964. ? Anyone with known risk factors for hepatitis C.  Sexually transmitted infections (STIs)  You should be screened for sexually transmitted infections (STIs) including gonorrhea and chlamydia if: ? You are sexually active and are younger than 61 years of age. ? You are older than 61 years of age and your health care provider tells you that you are at risk for this type of infection. ? Your sexual activity has changed since you were last screened and you are at an increased risk for chlamydia or gonorrhea. Ask your health care provider if you are at risk.  If you do not have HIV, but are at risk, it may be recommended that you take a prescription medicine daily to prevent HIV infection. This is called pre-exposure prophylaxis (PrEP). You are considered at risk if: ? You are  sexually active and do not regularly use condoms or know the HIV status of your partner(s). ? You take drugs by injection. ? You are sexually active with a partner who has HIV.  Talk with your health care provider about whether you are at high risk of being infected with HIV. If you choose to begin PrEP, you should first be tested for HIV. You should then be tested every 3 months  for as long as you are taking PrEP. Pregnancy  If you are premenopausal and you may become pregnant, ask your health care provider about preconception counseling.  If you may become pregnant, take 400 to 800 micrograms (mcg) of folic acid every day.  If you want to prevent pregnancy, talk to your health care provider about birth control (contraception). Osteoporosis and menopause  Osteoporosis is a disease in which the bones lose minerals and strength with aging. This can result in serious bone fractures. Your risk for osteoporosis can be identified using a bone density scan.  If you are 29 years of age or older, or if you are at risk for osteoporosis and fractures, ask your health care provider if you should be screened.  Ask your health care provider whether you should take a calcium or vitamin D supplement to lower your risk for osteoporosis.  Menopause may have certain physical symptoms and risks.  Hormone replacement therapy may reduce some of these symptoms and risks. Talk to your health care provider about whether hormone replacement therapy is right for you. Follow these instructions at home:  Schedule regular health, dental, and eye exams.  Stay current with your immunizations.  Do not use any tobacco products including cigarettes, chewing tobacco, or electronic cigarettes.  If you are pregnant, do not drink alcohol.  If you are breastfeeding, limit how much and how often you drink alcohol.  Limit alcohol intake to no more than 1 drink per day for nonpregnant women. One drink equals 12 ounces of  beer, 5 ounces of wine, or 1 ounces of hard liquor.  Do not use street drugs.  Do not share needles.  Ask your health care provider for help if you need support or information about quitting drugs.  Tell your health care provider if you often feel depressed.  Tell your health care provider if you have ever been abused or do not feel safe at home. This information is not intended to replace advice given to you by your health care provider. Make sure you discuss any questions you have with your health care provider. Document Released: 03/24/2011 Document Revised: 02/14/2016 Document Reviewed: 06/12/2015 Elsevier Interactive Patient Education  [employer].

## 2018-05-14 NOTE — Assessment & Plan Note (Signed)
Flu shot yearly. Shingrix counseled. Tetanus counseled. Colonoscopy up to date. Mammogram up to date with gyn, pap smear up to date with gyn and dexa up to date. Counseled about sun safety and mole surveillance. Counseled about the dangers of distracted driving. Given 10 year screening recommendations.

## 2018-05-21 ENCOUNTER — Encounter: Payer: Self-pay | Admitting: Internal Medicine

## 2018-05-21 NOTE — Progress Notes (Signed)
Abstracted and sent to scan  

## 2018-06-15 ENCOUNTER — Other Ambulatory Visit: Payer: Self-pay | Admitting: Internal Medicine

## 2018-06-22 ENCOUNTER — Encounter: Payer: Self-pay | Admitting: Internal Medicine

## 2018-06-22 ENCOUNTER — Ambulatory Visit: Payer: BC Managed Care – PPO | Admitting: Internal Medicine

## 2018-06-22 VITALS — BP 130/82 | HR 90 | Temp 98.2°F | Ht 60.0 in | Wt 130.0 lb

## 2018-06-22 DIAGNOSIS — M8580 Other specified disorders of bone density and structure, unspecified site: Secondary | ICD-10-CM

## 2018-06-22 DIAGNOSIS — Z23 Encounter for immunization: Secondary | ICD-10-CM | POA: Diagnosis not present

## 2018-06-22 DIAGNOSIS — M79604 Pain in right leg: Secondary | ICD-10-CM

## 2018-06-22 DIAGNOSIS — M79605 Pain in left leg: Secondary | ICD-10-CM | POA: Diagnosis not present

## 2018-06-22 NOTE — Progress Notes (Signed)
   Subjective:    Patient ID: Beth Aguilar, female    DOB: 1956/08/26, 62 y.o.   MRN: [id number]  HPI The patient is a 62 YO female coming in for hip and shin pain since starting boniva. She has taken 2 pills of it so far and is having worsening pain. Needing to take 3 aleve prior to bed to get to sleep. She has seen chiropractor and they think it is related to leg length difference and given her shoe lift and she will get orthotics. She is going to try that. She wanted to know if the boniva could be related or if it should not. Pain in the shins and the hips both are symmetric. Mild worsening. No fevers or chills. No rash or swelling.   Review of Systems  Constitutional: Negative.   HENT: Negative.   Eyes: Negative.   Respiratory: Negative for cough, chest tightness and shortness of breath.   Cardiovascular: Negative for chest pain, palpitations and leg swelling.  Gastrointestinal: Negative for abdominal distention, abdominal pain, constipation, diarrhea, nausea and vomiting.  Musculoskeletal: Positive for arthralgias, gait problem and myalgias.  Skin: Negative.   Psychiatric/Behavioral: Negative.       Objective:   Physical Exam  Constitutional: She is oriented to person, place, and time. She appears well-developed and well-nourished.  HENT:  Head: Normocephalic and atraumatic.  Eyes: EOM are normal.  Neck: Normal range of motion.  Cardiovascular: Normal rate and regular rhythm.  Pulmonary/Chest: Effort normal and breath sounds normal. No respiratory distress. She has no wheezes. She has no rales.  Abdominal: Soft. Bowel sounds are normal. She exhibits no distension. There is no tenderness. There is no rebound.  Musculoskeletal: She exhibits tenderness. She exhibits no edema.  Pain in the right and left shins as well as the left and right thigh  Neurological: She is alert and oriented to person, place, and time. Coordination normal.  Skin: Skin is warm and dry.  Psychiatric: She  has a normal mood and affect.   Vitals:   06/22/18 1346  BP: 130/82  Pulse: 90  Temp: 98.2 F (36.8 C)  TempSrc: Oral  SpO2: 97%  Weight: 130 lb (59 kg)  Height: 5' (1.524 m)      Assessment & Plan:  Flu shot given at visit  Visit time 25 minutes: greater than 50% of that time was spent in face to face counseling and coordination of care with the patient: counseled about the likelihood of effect of boniva on her pain versus other etiologies as well as counseling about benefit versus possible risk as well as her osteoporosis

## 2018-06-25 DIAGNOSIS — M79606 Pain in leg, unspecified: Secondary | ICD-10-CM | POA: Insufficient documentation

## 2018-06-25 DIAGNOSIS — M25561 Pain in right knee: Secondary | ICD-10-CM | POA: Insufficient documentation

## 2018-06-25 NOTE — Assessment & Plan Note (Signed)
Taking boniva and due for bone density next year. We talked about likelihood that this is related to her medication. She would like to try the suggestions from chiropractor prior to discontinuation of medication. This would make the second bisphosphonate she is not able to tolerate if boniva is failed.

## 2018-06-25 NOTE — Assessment & Plan Note (Signed)
This could be related to her medication (~5%) or from other factors that her chiropractor will be treating. Will wait on that treatment and if no resolution or worsening will stop boniva.

## 2018-09-06 ENCOUNTER — Encounter: Payer: Self-pay | Admitting: Internal Medicine

## 2018-09-06 ENCOUNTER — Ambulatory Visit: Payer: BC Managed Care – PPO | Admitting: Internal Medicine

## 2018-09-06 ENCOUNTER — Other Ambulatory Visit: Payer: Self-pay | Admitting: Internal Medicine

## 2018-09-06 DIAGNOSIS — R21 Rash and other nonspecific skin eruption: Secondary | ICD-10-CM | POA: Diagnosis not present

## 2018-09-06 DIAGNOSIS — J302 Other seasonal allergic rhinitis: Secondary | ICD-10-CM

## 2018-09-06 MED ORDER — METHYLPREDNISOLONE ACETATE 40 MG/ML IJ SUSP
40.0000 mg | Freq: Once | INTRAMUSCULAR | Status: AC
  Administered 2018-09-06: 40 mg via INTRAMUSCULAR

## 2018-09-06 NOTE — Assessment & Plan Note (Signed)
Depo-medrol 40 mg IM given at visit. Advised to use benadryl cream otc for itching.

## 2018-09-06 NOTE — Patient Instructions (Signed)
We have given you a shot today to help with the rash and symptoms.   We will check up when you come back how this is doing.

## 2018-09-06 NOTE — Addendum Note (Signed)
Addended by: Berton LanGULCH, BRIANA R on: 09/06/2018 04:28 PM   Modules accepted: Orders

## 2018-09-06 NOTE — Progress Notes (Signed)
   Subjective:    Patient ID: Beth Aguilar, female    DOB: 10/19/1955, 62 y.o.   MRN: [id number]  HPI The patient is a 62 YO female coming in for URI and rash symptoms. She does have sensitive skin and typically gets poison ivy once a year due to yard exposure. She typically does not have eczema. She does have cream which she has not tried. She is scratching quite a lot due to itching. She denies pain in the area. She does not want seen for URI symptoms as they are improving.   Review of Systems  Constitutional: Negative.   Respiratory: Negative for cough, chest tightness and shortness of breath.   Cardiovascular: Negative for chest pain, palpitations and leg swelling.  Gastrointestinal: Negative for abdominal distention, abdominal pain, constipation, diarrhea, nausea and vomiting.  Musculoskeletal: Negative.   Skin: Positive for rash.  Neurological: Negative.   Psychiatric/Behavioral: Negative.       Objective:   Physical Exam Constitutional:      Appearance: She is well-developed.  HENT:     Head: Normocephalic and atraumatic.  Neck:     Musculoskeletal: Normal range of motion.  Cardiovascular:     Rate and Rhythm: Normal rate and regular rhythm.  Pulmonary:     Effort: Pulmonary effort is normal. No respiratory distress.     Breath sounds: Normal breath sounds. No wheezing or rales.  Abdominal:     General: Bowel sounds are normal. There is no distension.     Palpations: Abdomen is soft.     Tenderness: There is no abdominal tenderness. There is no rebound.  Skin:    General: Skin is warm and dry.     Findings: Erythema present.     Comments: Atopic dermatitis posterior left worse than right with stigmata of scratching. No blistering or satellite lesions.   Neurological:     Mental Status: She is alert and oriented to person, place, and time.     Coordination: Coordination normal.    Vitals:   09/06/18 1509  BP: 140/84  Pulse: 82  Temp: 98.3 F (36.8 C)  TempSrc:  Oral  SpO2: 96%  Weight: 131 lb (59.4 kg)  Height: 5' (1.524 m)      Assessment & Plan:  Depo-medrol 40 mg IM

## 2018-09-16 ENCOUNTER — Encounter: Payer: Self-pay | Admitting: Internal Medicine

## 2018-09-16 ENCOUNTER — Ambulatory Visit: Payer: BC Managed Care – PPO | Admitting: Internal Medicine

## 2018-09-16 VITALS — BP 130/80 | HR 71 | Temp 97.9°F | Ht 60.0 in | Wt 130.0 lb

## 2018-09-16 DIAGNOSIS — M79605 Pain in left leg: Secondary | ICD-10-CM

## 2018-09-16 NOTE — Assessment & Plan Note (Signed)
Will refer to orthopedics for second opinion. She is getting some relief from insert from orthopedics. She was curious if mattress could be the cause which seems unlikely as pain is worst at night and best in the morning after awakening.

## 2018-09-16 NOTE — Progress Notes (Signed)
   Subjective:   Patient ID: Beth Aguilar, female    DOB: [birth year], 62 y.o.   MRN: [id number]  HPI The patient is a 62 YO female coming in for pain in the left leg. Started about months to year ago. Denies injury at the onset. Is wondering if she can see a specialist about this. She has seen chiropractor and they gave her insert and told her one leg is 3 inches longer than the other. The insert is helping some. Overall it is stable to mild worsening. She denies injury, numbness or weakness in her leg. Has tried aleve which helps some. Pain is 5/10. Worse with lots of activity and starts more in the evening once she stops moving. Better with rest and almost no pain when she wakes in the morning.   Review of Systems  Constitutional: Positive for activity change. Negative for appetite change, chills, fatigue, fever and unexpected weight change.  Respiratory: Negative.   Cardiovascular: Negative.   Gastrointestinal: Negative.   Musculoskeletal: Positive for arthralgias, back pain and myalgias. Negative for gait problem, joint swelling, neck pain and neck stiffness.  Skin: Negative.   Neurological: Negative.     Objective:  Physical Exam Constitutional:      Appearance: She is well-developed.  HENT:     Head: Normocephalic and atraumatic.  Neck:     Musculoskeletal: Normal range of motion.  Cardiovascular:     Rate and Rhythm: Normal rate and regular rhythm.  Pulmonary:     Effort: Pulmonary effort is normal. No respiratory distress.     Breath sounds: Normal breath sounds. No wheezing or rales.  Abdominal:     General: Bowel sounds are normal. There is no distension.     Palpations: Abdomen is soft.     Tenderness: There is no abdominal tenderness. There is no rebound.  Musculoskeletal:        General: Tenderness present.     Comments: Mild tenderness left SI joint  Skin:    General: Skin is warm and dry.  Neurological:     Mental Status: She is alert and oriented to person,  place, and time.     Coordination: Coordination normal.     Vitals:   09/16/18 0917 09/16/18 0940  BP: (!) 150/90 130/80  Pulse: 71   Temp: 97.9 F (36.6 C)   TempSrc: Oral   SpO2: 99%   Weight: 130 lb (59 kg)   Height: 5' (1.524 m)     Assessment & Plan:

## 2018-09-16 NOTE — Patient Instructions (Signed)
We will get you in with the orthopedic.  ° ° °

## 2018-11-04 ENCOUNTER — Other Ambulatory Visit: Payer: BC Managed Care – PPO

## 2018-11-04 ENCOUNTER — Encounter: Payer: Self-pay | Admitting: Internal Medicine

## 2018-11-04 ENCOUNTER — Ambulatory Visit: Payer: BC Managed Care – PPO | Admitting: Internal Medicine

## 2018-11-04 VITALS — BP 110/70 | HR 92 | Temp 97.5°F | Ht 60.0 in | Wt 127.0 lb

## 2018-11-04 DIAGNOSIS — R21 Rash and other nonspecific skin eruption: Secondary | ICD-10-CM

## 2018-11-04 MED ORDER — TRIAMCINOLONE ACETONIDE 0.1 % EX CREA: 1.0000 "application " | TOPICAL_CREAM | Freq: Two times a day (BID) | CUTANEOUS | 0 refills | Status: DC

## 2018-11-04 NOTE — Progress Notes (Signed)
   Subjective:   Patient ID: Beth Aguilar, female    DOB: 1956/08/22, 63 y.o.   MRN: [id number]  HPI The patient is a 63 YO female coming in for rash which is recurrent. She had gone to dermatology and they were not able to tell her what this was. They did give her a prednisone shot and this stopped the rash for some time and then wore off. She denies fevers or chills. Has chronic arthritis which is not changed. She is concerned as a therapist (unclear if counselor or PT she did not specify) told her it could be psoriasis. She does have significant itching and does scratch which causes the rash. She is trying not to scratch. Is not using any creams on this.   Review of Systems  Constitutional: Negative.   HENT: Negative.   Eyes: Negative.   Respiratory: Negative for cough, chest tightness and shortness of breath.   Cardiovascular: Negative for chest pain, palpitations and leg swelling.  Gastrointestinal: Negative for abdominal distention, abdominal pain, constipation, diarrhea, nausea and vomiting.  Musculoskeletal: Negative.   Skin: Positive for rash.  Neurological: Negative.   Psychiatric/Behavioral: Negative.     Objective:  Physical Exam Constitutional:      Appearance: She is well-developed.  HENT:     Head: Normocephalic and atraumatic.  Neck:     Musculoskeletal: Normal range of motion.  Cardiovascular:     Rate and Rhythm: Normal rate and regular rhythm.  Pulmonary:     Effort: Pulmonary effort is normal. No respiratory distress.     Breath sounds: Normal breath sounds. No wheezing or rales.  Abdominal:     General: Bowel sounds are normal. There is no distension.     Palpations: Abdomen is soft.     Tenderness: There is no abdominal tenderness. There is no rebound.  Skin:    General: Skin is warm and dry.     Findings: Rash present.     Comments: Rash on the right ankle without cellulitis,   Neurological:     Mental Status: She is alert and oriented to person,  place, and time.     Coordination: Coordination normal.     Vitals:   11/04/18 1533  BP: 110/70  Pulse: 92  Temp: (!) 97.5 F (36.4 C)  TempSrc: Oral  SpO2: 97%  Weight: 127 lb (57.6 kg)  Height: 5' (1.524 m)    Assessment & Plan:

## 2018-11-04 NOTE — Patient Instructions (Signed)
Famotidine you can take for itching if that gets bad.   We have sent in triamcinolone cream to use twice a day on the rash areas.

## 2018-11-05 NOTE — Assessment & Plan Note (Signed)
Rx for triamcinolone cream and checking ANA panel to rule out auto-immune disease although we talked about how this does not look like that. Advised to not itch if at all possible and use benadryl cream for itching.

## 2018-11-07 LAB — ANA, IFA COMPREHENSIVE PANEL
Anti Nuclear Antibody(ANA): NEGATIVE
ENA SM Ab Ser-aCnc: <1 AI
SM/RNP: <1 AI
SSA (Ro) (ENA) Antibody, IgG: <1 AI
SSB (La) (ENA) Antibody, IgG: <1 AI
Scleroderma (Scl-70) (ENA) Antibody, IgG: <1 AI
ds DNA Ab: <1 IU/mL

## 2019-03-08 ENCOUNTER — Encounter: Payer: Self-pay | Admitting: Internal Medicine

## 2019-03-08 ENCOUNTER — Ambulatory Visit (INDEPENDENT_AMBULATORY_CARE_PROVIDER_SITE_OTHER): Payer: BC Managed Care – PPO | Admitting: Internal Medicine

## 2019-03-08 ENCOUNTER — Other Ambulatory Visit: Payer: Self-pay

## 2019-03-08 VITALS — BP 132/80 | HR 85 | Temp 98.2°F | Ht 60.0 in | Wt 132.0 lb

## 2019-03-08 DIAGNOSIS — R21 Rash and other nonspecific skin eruption: Secondary | ICD-10-CM

## 2019-03-08 MED ORDER — METHYLPREDNISOLONE ACETATE 40 MG/ML IJ SUSP
40.0000 mg | Freq: Once | INTRAMUSCULAR | Status: AC
  Administered 2019-03-08: 40 mg via INTRAMUSCULAR

## 2019-03-08 NOTE — Assessment & Plan Note (Signed)
Given depo-medrol 40 mg IM and advised to use benadryl topical and triamcinolone ointment.

## 2019-03-08 NOTE — Patient Instructions (Signed)
We have given you the shot today.  You can use topical benadryl or the triamcinolone ointment you have left.

## 2019-03-08 NOTE — Progress Notes (Signed)
   Subjective:   Patient ID: Beth Aguilar, female    DOB: 04/04/1956, 63 y.o.   MRN: [id number]  HPI The patient is a 63 YO female coming in for poison ivy. She typically gets this at least once a year and depo-medrol injection seems to help well. She cannot tolerate oral benadryl. Has been scratching and attempting not to scratch. She denies fevers or chills. Denies cough or SOB. Rash is mostly on neck and chest. Overall it is spreading. Started 2-3 days ago.   Review of Systems  Constitutional: Negative.   HENT: Negative.   Eyes: Negative.   Respiratory: Negative for cough, chest tightness and shortness of breath.   Cardiovascular: Negative for chest pain, palpitations and leg swelling.  Gastrointestinal: Negative for abdominal distention, abdominal pain, constipation, diarrhea, nausea and vomiting.  Musculoskeletal: Negative.   Skin: Positive for rash.  Neurological: Negative.   Psychiatric/Behavioral: Negative.     Objective:  Physical Exam Constitutional:      Appearance: She is well-developed.  HENT:     Head: Normocephalic and atraumatic.  Neck:     Musculoskeletal: Normal range of motion.  Cardiovascular:     Rate and Rhythm: Normal rate and regular rhythm.  Pulmonary:     Effort: Pulmonary effort is normal. No respiratory distress.     Breath sounds: Normal breath sounds. No wheezing or rales.  Abdominal:     General: Bowel sounds are normal. There is no distension.     Palpations: Abdomen is soft.     Tenderness: There is no abdominal tenderness. There is no rebound.  Skin:    General: Skin is warm and dry.     Findings: Rash present.     Comments: Rash on the neck and upper chest region and some in the elbow region as well.   Neurological:     Mental Status: She is alert and oriented to person, place, and time.     Coordination: Coordination normal.     Vitals:   03/08/19 1431  BP: 132/80  Pulse: 85  Temp: 98.2 F (36.8 C)  TempSrc: Oral  SpO2: 98%   Weight: 132 lb (59.9 kg)  Height: 5' (1.524 m)    Assessment & Plan:  Depo-medrol 40 mg IM

## 2019-05-18 ENCOUNTER — Other Ambulatory Visit: Payer: Self-pay | Admitting: Internal Medicine

## 2019-05-18 DIAGNOSIS — J302 Other seasonal allergic rhinitis: Secondary | ICD-10-CM

## 2019-06-20 ENCOUNTER — Other Ambulatory Visit: Payer: Self-pay | Admitting: Internal Medicine

## 2019-07-25 ENCOUNTER — Ambulatory Visit (INDEPENDENT_AMBULATORY_CARE_PROVIDER_SITE_OTHER): Payer: BC Managed Care – PPO | Admitting: Internal Medicine

## 2019-07-25 ENCOUNTER — Other Ambulatory Visit (INDEPENDENT_AMBULATORY_CARE_PROVIDER_SITE_OTHER): Payer: BC Managed Care – PPO

## 2019-07-25 ENCOUNTER — Other Ambulatory Visit: Payer: Self-pay

## 2019-07-25 ENCOUNTER — Encounter: Payer: Self-pay | Admitting: Internal Medicine

## 2019-07-25 VITALS — BP 150/90 | HR 88 | Temp 97.7°F | Ht 60.0 in | Wt 132.0 lb

## 2019-07-25 DIAGNOSIS — M2550 Pain in unspecified joint: Secondary | ICD-10-CM | POA: Diagnosis not present

## 2019-07-25 LAB — CBC
HCT: 40.5 % (ref 36.0–46.0)
Hemoglobin: 13.7 g/dL (ref 12.0–15.0)
MCHC: 33.8 g/dL (ref 30.0–36.0)
MCV: 91.7 fl (ref 78.0–100.0)
Platelets: 180 10*3/uL (ref 150.0–400.0)
RBC: 4.42 Mil/uL (ref 3.87–5.11)
RDW: 12.4 % (ref 11.5–15.5)
WBC: 8.2 10*3/uL (ref 4.0–10.5)

## 2019-07-25 LAB — COMPREHENSIVE METABOLIC PANEL
ALT: 14 U/L (ref 0–35)
AST: 18 U/L (ref 0–37)
Albumin: 4.4 g/dL (ref 3.5–5.2)
Alkaline Phosphatase: 61 U/L (ref 39–117)
BUN: 17 mg/dL (ref 6–23)
CO2: 28 mEq/L (ref 19–32)
Calcium: 9.4 mg/dL (ref 8.4–10.5)
Chloride: 103 mEq/L (ref 96–112)
Creatinine, Ser: 0.8 mg/dL (ref 0.40–1.20)
GFR: 72.56 mL/min (ref >60.00)
Glucose, Bld: 116 mg/dL — ABNORMAL HIGH (ref 70–99)
Potassium: 4 mEq/L (ref 3.5–5.1)
Sodium: 139 mEq/L (ref 135–145)
Total Bilirubin: 0.4 mg/dL (ref 0.2–1.2)
Total Protein: 7.2 g/dL (ref 6.0–8.3)

## 2019-07-25 LAB — LIPID PANEL
Cholesterol: 155 mg/dL (ref 0–200)
HDL: 44.9 mg/dL (ref >39.00)
LDL Cholesterol: 72 mg/dL (ref 0–99)
NonHDL: 110.22
Total CHOL/HDL Ratio: 3
Triglycerides: 189 mg/dL — ABNORMAL HIGH (ref 0.0–149.0)
VLDL: 37.8 mg/dL (ref 0.0–40.0)

## 2019-07-25 LAB — VITAMIN D 25 HYDROXY (VIT D DEFICIENCY, FRACTURES): VITD: 31.89 ng/mL (ref 30.00–100.00)

## 2019-07-25 LAB — T4, FREE: Free T4: 1.02 ng/dL (ref 0.60–1.60)

## 2019-07-25 LAB — VITAMIN B12: Vitamin B-12: 407 pg/mL (ref 211–911)

## 2019-07-25 LAB — HEMOGLOBIN A1C: Hgb A1c MFr Bld: 5.6 % (ref 4.6–6.5)

## 2019-07-25 LAB — TSH: TSH: 1.37 u[IU]/mL (ref 0.35–4.50)

## 2019-07-25 NOTE — Patient Instructions (Signed)
Shoulder Exercises Ask your health care provider which exercises are safe for you. Do exercises exactly as told by your health care provider and adjust them as directed. It is normal to feel mild stretching, pulling, tightness, or discomfort as you do these exercises. Stop right away if you feel sudden pain or your pain gets worse. Do not begin these exercises until told by your health care provider. Stretching exercises External rotation and abduction This exercise is sometimes called corner stretch. This exercise rotates your arm outward (external rotation) and moves your arm out from your body (abduction). 1. Stand in a doorway with one of your feet slightly in front of the other. This is called a staggered stance. If you cannot reach your forearms to the door frame, stand facing a corner of a room. 2. Choose one of the following positions as told by your health care provider: ? Place your hands and forearms on the door frame above your head. ? Place your hands and forearms on the door frame at the height of your head. ? Place your hands on the door frame at the height of your elbows. 3. Slowly move your weight onto your front foot until you feel a stretch across your chest and in the front of your shoulders. Keep your head and chest upright and keep your abdominal muscles tight. 4. Hold for __________ seconds. 5. To release the stretch, shift your weight to your back foot. Repeat __________ times. Complete this exercise __________ times a day. Extension, standing 1. Stand and hold a broomstick, a cane, or a similar object behind your back. ? Your hands should be a little wider than shoulder width apart. ? Your palms should face away from your back. 2. Keeping your elbows straight and your shoulder muscles relaxed, move the stick away from your body until you feel a stretch in your shoulders (extension). ? Avoid shrugging your shoulders while you move the stick. Keep your shoulder blades tucked  down toward the middle of your back. 3. Hold for __________ seconds. 4. Slowly return to the starting position. Repeat __________ times. Complete this exercise __________ times a day. Range-of-motion exercises Pendulum  1. Stand near a wall or a surface that you can hold onto for balance. 2. Bend at the waist and let your left / right arm hang straight down. Use your other arm to support you. Keep your back straight and do not lock your knees. 3. Relax your left / right arm and shoulder muscles, and move your hips and your trunk so your left / right arm swings freely. Your arm should swing because of the motion of your body, not because you are using your arm or shoulder muscles. 4. Keep moving your hips and trunk so your arm swings in the following directions, as told by your health care provider: ? Side to side. ? Forward and backward. ? In clockwise and counterclockwise circles. 5. Continue each motion for __________ seconds, or for as long as told by your health care provider. 6. Slowly return to the starting position. Repeat __________ times. Complete this exercise __________ times a day. Shoulder flexion, standing  1. Stand and hold a broomstick, a cane, or a similar object. Place your hands a little more than shoulder width apart on the object. Your left / right hand should be palm up, and your other hand should be palm down. 2. Keep your elbow straight and your shoulder muscles relaxed. Push the stick up with your healthy arm to   raise your left / right arm in front of your body, and then over your head until you feel a stretch in your shoulder (flexion). ? Avoid shrugging your shoulder while you raise your arm. Keep your shoulder blade tucked down toward the middle of your back. 3. Hold for __________ seconds. 4. Slowly return to the starting position. Repeat __________ times. Complete this exercise __________ times a day. Shoulder abduction, standing 1. Stand and hold a broomstick,  a cane, or a similar object. Place your hands a little more than shoulder width apart on the object. Your left / right hand should be palm up, and your other hand should be palm down. 2. Keep your elbow straight and your shoulder muscles relaxed. Push the object across your body toward your left / right side. Raise your left / right arm to the side of your body (abduction) until you feel a stretch in your shoulder. ? Do not raise your arm above shoulder height unless your health care provider tells you to do that. ? If directed, raise your arm over your head. ? Avoid shrugging your shoulder while you raise your arm. Keep your shoulder blade tucked down toward the middle of your back. 3. Hold for __________ seconds. 4. Slowly return to the starting position. Repeat __________ times. Complete this exercise __________ times a day. Internal rotation  1. Place your left / right hand behind your back, palm up. 2. Use your other hand to dangle an exercise band, a towel, or a similar object over your shoulder. Grasp the band with your left / right hand so you are holding on to both ends. 3. Gently pull up on the band until you feel a stretch in the front of your left / right shoulder. The movement of your arm toward the center of your body is called internal rotation. ? Avoid shrugging your shoulder while you raise your arm. Keep your shoulder blade tucked down toward the middle of your back. 4. Hold for __________ seconds. 5. Release the stretch by letting go of the band and lowering your hands. Repeat __________ times. Complete this exercise __________ times a day. Strengthening exercises External rotation  1. Sit in a stable chair without armrests. 2. Secure an exercise band to a stable object at elbow height on your left / right side. 3. Place a soft object, such as a folded towel or a small pillow, between your left / right upper arm and your body to move your elbow about 4 inches (10 cm) away  from your side. 4. Hold the end of the exercise band so it is tight and there is no slack. 5. Keeping your elbow pressed against the soft object, slowly move your forearm out, away from your abdomen (external rotation). Keep your body steady so only your forearm moves. 6. Hold for __________ seconds. 7. Slowly return to the starting position. Repeat __________ times. Complete this exercise __________ times a day. Shoulder abduction  1. Sit in a stable chair without armrests, or stand up. 2. Hold a __________ weight in your left / right hand, or hold an exercise band with both hands. 3. Start with your arms straight down and your left / right palm facing in, toward your body. 4. Slowly lift your left / right hand out to your side (abduction). Do not lift your hand above shoulder height unless your health care provider tells you that this is safe. ? Keep your arms straight. ? Avoid shrugging your shoulder while you   do this movement. Keep your shoulder blade tucked down toward the middle of your back. 5. Hold for __________ seconds. 6. Slowly lower your arm, and return to the starting position. Repeat __________ times. Complete this exercise __________ times a day. Shoulder extension 1. Sit in a stable chair without armrests, or stand up. 2. Secure an exercise band to a stable object in front of you so it is at shoulder height. 3. Hold one end of the exercise band in each hand. Your palms should face each other. 4. Straighten your elbows and lift your hands up to shoulder height. 5. Step back, away from the secured end of the exercise band, until the band is tight and there is no slack. 6. Squeeze your shoulder blades together as you pull your hands down to the sides of your thighs (extension). Stop when your hands are straight down by your sides. Do not let your hands go behind your body. 7. Hold for __________ seconds. 8. Slowly return to the starting position. Repeat __________ times.  Complete this exercise __________ times a day. Shoulder row 1. Sit in a stable chair without armrests, or stand up. 2. Secure an exercise band to a stable object in front of you so it is at waist height. 3. Hold one end of the exercise band in each hand. Position your palms so that your thumbs are facing the ceiling (neutral position). 4. Bend each of your elbows to a 90-degree angle (right angle) and keep your upper arms at your sides. 5. Step back until the band is tight and there is no slack. 6. Slowly pull your elbows back behind you. 7. Hold for __________ seconds. 8. Slowly return to the starting position. Repeat __________ times. Complete this exercise __________ times a day. Shoulder press-ups  1. Sit in a stable chair that has armrests. Sit upright, with your feet flat on the floor. 2. Put your hands on the armrests so your elbows are bent and your fingers are pointing forward. Your hands should be about even with the sides of your body. 3. Push down on the armrests and use your arms to lift yourself off the chair. Straighten your elbows and lift yourself up as much as you comfortably can. ? Move your shoulder blades down, and avoid letting your shoulders move up toward your ears. ? Keep your feet on the ground. As you get stronger, your feet should support less of your body weight as you lift yourself up. 4. Hold for __________ seconds. 5. Slowly lower yourself back into the chair. Repeat __________ times. Complete this exercise __________ times a day. Wall push-ups  1. Stand so you are facing a stable wall. Your feet should be about one arm-length away from the wall. 2. Lean forward and place your palms on the wall at shoulder height. 3. Keep your feet flat on the floor as you bend your elbows and lean forward toward the wall. 4. Hold for __________ seconds. 5. Straighten your elbows to push yourself back to the starting position. Repeat __________ times. Complete this exercise  __________ times a day. This information is not intended to replace advice given to you by your health care provider. Make sure you discuss any questions you have with your health care provider. Document Released: 07/23/2005 Document Revised: 12/31/2018 Document Reviewed: 10/08/2018 Elsevier Patient Education  2020 [employer]  

## 2019-07-25 NOTE — Progress Notes (Signed)
   Subjective:   Patient ID: Beth Aguilar, female    DOB: 1956-08-01, 63 y.o.   MRN: [id number]  HPI The patient is a 63 YO female coming in for right shoulder pain. 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 Was overall worsening throughout the month. She denies numbness or weakness in the arm. Denies injury. Denies dropping things. Overall is better in the last 2 days. Has been taking aleve or ibuprofen. Having 7/10 pain. Overall it is improving. Is concerned due to other joints hurting also and mom and grandma with RA.   Review of Systems  Constitutional: Negative.   HENT: Negative.   Eyes: Negative.   Respiratory: Negative for cough, chest tightness and shortness of breath.   Cardiovascular: Negative for chest pain, palpitations and leg swelling.  Gastrointestinal: Negative for abdominal distention, abdominal pain, constipation, diarrhea, nausea and vomiting.  Musculoskeletal: Positive for arthralgias.  Skin: Negative.   Neurological: Negative.   Psychiatric/Behavioral: Negative.     Objective:  Physical Exam Constitutional:      Appearance: She is well-developed.  HENT:     Head: Normocephalic and atraumatic.  Neck:     Musculoskeletal: Normal range of motion.  Cardiovascular:     Rate and Rhythm: Normal rate and regular rhythm.  Pulmonary:     Effort: Pulmonary effort is normal. No respiratory distress.     Breath sounds: Normal breath sounds. No wheezing or rales.  Abdominal:     General: Bowel sounds are normal. There is no distension.     Palpations: Abdomen is soft.     Tenderness: There is no abdominal tenderness. There is no rebound.  Musculoskeletal:        General: Tenderness present.  Skin:    General: Skin is warm and dry.  Neurological:     Mental Status: She is alert and oriented to person, place, and time.     Coordination: Coordination normal.     Vitals:   07/25/19 1519  BP: (!) 150/90  Pulse: 88  Temp: 97.7 F (36.5 C)   TempSrc: Oral  SpO2: 97%  Weight: 132 lb (59.9 kg)  Height: 5' (1.524 m)    Assessment & Plan:

## 2019-07-25 NOTE — Assessment & Plan Note (Signed)
Checking for RA given family history as well as causes for neuropathy given some cotton wrapping sensation in the toes. Given the shoulder is healing no specific treatment or imaging is indicated. Given exercises for the shoulder pain.

## 2019-07-27 LAB — ANA,IFA RA DIAG PNL W/RFLX TIT/PATN
Anti Nuclear Antibody (ANA): POSITIVE — AB
Cyclic Citrullin Peptide Ab: <16 UNITS
Rheumatoid fact SerPl-aCnc: <14 IU/mL (ref <14)

## 2019-07-27 LAB — ANTI-NUCLEAR AB-TITER (ANA TITER): ANA Titer 1: 1:40 {titer} — ABNORMAL HIGH

## 2019-08-10 ENCOUNTER — Encounter: Payer: Self-pay | Admitting: Internal Medicine

## 2019-08-10 ENCOUNTER — Other Ambulatory Visit: Payer: Self-pay

## 2019-08-10 ENCOUNTER — Ambulatory Visit (INDEPENDENT_AMBULATORY_CARE_PROVIDER_SITE_OTHER): Payer: BC Managed Care – PPO | Admitting: Internal Medicine

## 2019-08-10 ENCOUNTER — Ambulatory Visit: Payer: Self-pay | Admitting: *Deleted

## 2019-08-10 ENCOUNTER — Other Ambulatory Visit (INDEPENDENT_AMBULATORY_CARE_PROVIDER_SITE_OTHER): Payer: BC Managed Care – PPO

## 2019-08-10 VITALS — BP 146/94 | HR 88 | Temp 97.7°F | Resp 16 | Ht 60.0 in | Wt 130.8 lb

## 2019-08-10 DIAGNOSIS — N3001 Acute cystitis with hematuria: Secondary | ICD-10-CM | POA: Diagnosis not present

## 2019-08-10 DIAGNOSIS — R310 Gross hematuria: Secondary | ICD-10-CM | POA: Insufficient documentation

## 2019-08-10 LAB — POC URINALSYSI DIPSTICK (AUTOMATED)
Bilirubin, UA: NEGATIVE
Blood, UA: POSITIVE
Glucose, UA: NEGATIVE
Ketones, UA: POSITIVE
Nitrite, UA: NEGATIVE
Protein, UA: POSITIVE — AB
Spec Grav, UA: 1.02 (ref 1.010–1.025)
Urobilinogen, UA: 0.2 E.U./dL
pH, UA: 6 (ref 5.0–8.0)

## 2019-08-10 LAB — CBC WITH DIFFERENTIAL/PLATELET
Basophils Absolute: 0.1 10*3/uL (ref 0.0–0.1)
Basophils Relative: 0.7 % (ref 0.0–3.0)
Eosinophils Absolute: 0.1 10*3/uL (ref 0.0–0.7)
Eosinophils Relative: 1 % (ref 0.0–5.0)
HCT: 40.9 % (ref 36.0–46.0)
Hemoglobin: 13.7 g/dL (ref 12.0–15.0)
Lymphocytes Relative: 14.3 % (ref 12.0–46.0)
Lymphs Abs: 1.9 10*3/uL (ref 0.7–4.0)
MCHC: 33.5 g/dL (ref 30.0–36.0)
MCV: 91.6 fl (ref 78.0–100.0)
Monocytes Absolute: 1.2 10*3/uL — ABNORMAL HIGH (ref 0.1–1.0)
Monocytes Relative: 8.9 % (ref 3.0–12.0)
Neutro Abs: 10.1 10*3/uL — ABNORMAL HIGH (ref 1.4–7.7)
Neutrophils Relative %: 75.1 % (ref 43.0–77.0)
Platelets: 208 10*3/uL (ref 150.0–400.0)
RBC: 4.46 Mil/uL (ref 3.87–5.11)
RDW: 12.6 % (ref 11.5–15.5)
WBC: 13.5 10*3/uL — ABNORMAL HIGH (ref 4.0–10.5)

## 2019-08-10 LAB — BASIC METABOLIC PANEL
BUN: 13 mg/dL (ref 6–23)
CO2: 28 mEq/L (ref 19–32)
Calcium: 9.3 mg/dL (ref 8.4–10.5)
Chloride: 100 mEq/L (ref 96–112)
Creatinine, Ser: 0.83 mg/dL (ref 0.40–1.20)
GFR: 69.53 mL/min (ref >60.00)
Glucose, Bld: 92 mg/dL (ref 70–99)
Potassium: 3.7 mEq/L (ref 3.5–5.1)
Sodium: 137 mEq/L (ref 135–145)

## 2019-08-10 MED ORDER — NITROFURANTOIN MONOHYD MACRO 100 MG PO CAPS: 100.0000 mg | ORAL_CAPSULE | Freq: Two times a day (BID) | ORAL | 0 refills | Status: AC

## 2019-08-10 NOTE — Telephone Encounter (Signed)
Pt called with having pain, thinking she has a UTI. She has had pain for a day and half  Until this morning. Went to the bathroom and now has blood in her urine. She also saw solid pieces which may be clots.  She is having pain when she urinates and also some lower abd pain.  She denies fever.  Per protocol, she should be seen within 24 hours.  She is advised to continue forcing fluids and trying cranberry juice. She did have Azo but it has expired. She is requesting an appointment for today. [facility] at [facility] notified for an appointment. Call conference in with the patient.  Routing to the practice.  Reason for Disposition . [1] Pink or red-colored urine and likely from food (beets, rhubarb, red food dye) AND [2] lasts > 24 hours after stopping food  Answer Assessment - Initial Assessment Questions 1. COLOR of URINE: "Describe the color of the urine."  (e.g., tea-colored, pink, red, blood clots, bloody)     Seeing solid pieces of blood in urine 2. ONSET: "When did the bleeding start?"      today 3. EPISODES: "How many times has there been blood in the urine?" or "How many times today?"     Just now 4. PAIN with URINATION: "Is there any pain with passing your urine?" If so, ask: "How bad is the pain?"  (Scale 1-10; or mild, moderate, severe)    - MILD - complains slightly about urination hurting    - MODERATE - interferes with normal activities      - SEVERE - excruciating, unwilling or unable to urinate because of the pain      Pain is mild to moderate 5. FEVER: "Do you have a fever?" If so, ask: "What is your temperature, how was it measured, and when did it start?"     no 6. ASSOCIATED SYMPTOMS: "Are you passing urine more frequently than usual?"     More often 7. OTHER SYMPTOMS: "Do you have any other symptoms?" (e.g., back/flank pain, abdominal pain, vomiting)     Pain in the urinary tract when she voiding and slightly abd . 8. PREGNANCY: "Is there any chance you are  pregnant?" "When was your last menstrual period?"     n/a  Protocols used: URINE - BLOOD IN-A-AH

## 2019-08-10 NOTE — Patient Instructions (Signed)
Hematuria, Adult Hematuria is blood in the urine. Blood may be visible in the urine, or it may be identified with a test. This condition can be caused by infections of the bladder, urethra, kidney, or prostate. Other possible causes include:  Kidney stones.  Cancer of the urinary tract.  Too much calcium in the urine.  Conditions that are passed from parent to child (inherited conditions).  Exercise that requires a lot of energy. Infections can usually be treated with medicine, and a kidney stone usually will pass through your urine. If neither of these is the cause of your hematuria, more tests may be needed to identify the cause of your symptoms. It is very important to tell your health care provider about any blood in your urine, even if it is painless or the blood stops without treatment. Blood in the urine, when it happens and then stops and then happens again, can be a symptom of a very serious condition, including cancer. There is no pain in the initial stages of many urinary cancers. Follow these instructions at home: Medicines  Take over-the-counter and prescription medicines only as told by your health care provider.  If you were prescribed an antibiotic medicine, take it as told by your health care provider. Do not stop taking the antibiotic even if you start to feel better. Eating and drinking  Drink enough fluid to keep your urine clear or pale yellow. It is recommended that you drink 3-4 quarts (2.8-3.8 L) a day. If you have been diagnosed with an infection, it is recommended that you drink cranberry juice in addition to large amounts of water.  Avoid caffeine, tea, and carbonated beverages. These tend to irritate the bladder.  Avoid alcohol because it may irritate the prostate (men). General instructions  If you have been diagnosed with a kidney stone, follow your health care provider's instructions about straining your urine to catch the stone.  Empty your bladder  often. Avoid holding urine for long periods of time.  If you are female: ? After a bowel movement, wipe from front to back and use each piece of toilet paper only once. ? Empty your bladder before and after sex.  Pay attention to any changes in your symptoms. Tell your health care provider about any changes or any new symptoms.  It is your responsibility to get your test results. Ask your health care provider, or the department performing the test, when your results will be ready.  Keep all follow-up visits as told by your health care provider. This is important. Contact a health care provider if:  You develop back pain.  You have a fever.  You have nausea or vomiting.  Your symptoms do not improve after 3 days.  Your symptoms get worse. Get help right away if:  You develop severe vomiting and are unable take medicine without vomiting.  You develop severe pain in your back or abdomen even though you are taking medicine.  You pass a large amount of blood in your urine.  You pass blood clots in your urine.  You feel very weak or like you might faint.  You faint. Summary  Hematuria is blood in the urine. It has many possible causes.  It is very important that you tell your health care provider about any blood in your urine, even if it is painless or the blood stops without treatment.  Take over-the-counter and prescription medicines only as told by your health care provider.  Drink enough fluid to keep   your urine clear or pale yellow. This information is not intended to replace advice given to you by your health care provider. Make sure you discuss any questions you have with your health care provider. Document Released: 09/08/2005 Document Revised: 08/21/2017 Document Reviewed: 10/11/2016 Elsevier Patient Education  2020 [employer]  

## 2019-08-10 NOTE — Progress Notes (Signed)
Subjective:  Patient ID: Beth Aguilar, female    DOB: 15-Jul-1956  Age: 63 y.o. MRN: [id number]  CC: Urinary Tract Infection   HPI Beth Aguilar presents for the complaint of a 3-day history of dysuria and now she has developed gross hematuria.  Outpatient Medications Prior to Visit  Medication Sig Dispense Refill  . aspirin 81 MG tablet Take 1 tablet (81 mg total) by mouth daily. 100 tablet 99  . azelastine (ASTELIN) 0.1 % nasal spray PLACE 2 SPRAYS INTO EACH NOSTRIL 2 TIMES DAILY AS DIRECTED. 30 mL 2  . rosuvastatin (CRESTOR) 10 MG tablet TAKE 1 TABLET BY MOUTH DAILY. 90 tablet 3  . triamcinolone cream (KENALOG) 0.1 % Apply 1 application topically 2 (two) times daily. 100 g 0   No facility-administered medications prior to visit.     ROS Review of Systems  Constitutional: Negative for chills, diaphoresis, fatigue and fever.  HENT: Negative.   Eyes: Negative.   Respiratory: Negative for cough, chest tightness, shortness of breath and wheezing.   Cardiovascular: Negative for chest pain, palpitations and leg swelling.  Gastrointestinal: Negative for abdominal pain, diarrhea, nausea and vomiting.  Endocrine: Negative.   Genitourinary: Positive for dysuria and hematuria. Negative for decreased urine volume, difficulty urinating, flank pain, urgency and vaginal bleeding.  Musculoskeletal: Negative for arthralgias and myalgias.  Skin: Negative.   Neurological: Negative.  Negative for dizziness, weakness and light-headedness.  Hematological: Negative for adenopathy. Does not bruise/bleed easily.  Psychiatric/Behavioral: Negative.     Objective:  BP (!) 146/94 (BP Location: Left Arm, Patient Position: Sitting, Cuff Size: Normal)   Pulse 88   Temp 97.7 F (36.5 C) (Oral)   Resp 16   Ht 5' (1.524 m)   Wt 130 lb 12 oz (59.3 kg)   SpO2 98%   BMI 25.54 kg/m   BP Readings from Last 3 Encounters:  08/10/19 (!) 146/94  07/25/19 (!) 150/90  03/08/19 132/80    Wt Readings from  Last 3 Encounters:  08/10/19 130 lb 12 oz (59.3 kg)  07/25/19 132 lb (59.9 kg)  03/08/19 132 lb (59.9 kg)    Physical Exam Vitals signs reviewed.  Constitutional:      Appearance: Normal appearance. She is not ill-appearing or diaphoretic.  HENT:     Nose: Nose normal.     Mouth/Throat:     Mouth: Mucous membranes are moist.  Eyes:     General: No scleral icterus.    Conjunctiva/sclera: Conjunctivae normal.  Neck:     Musculoskeletal: Neck supple.  Cardiovascular:     Rate and Rhythm: Normal rate and regular rhythm.     Heart sounds: No murmur.  Pulmonary:     Effort: Pulmonary effort is normal.     Breath sounds: No stridor. No wheezing, rhonchi or rales.  Abdominal:     General: Abdomen is protuberant. Bowel sounds are normal. There is no distension.     Palpations: There is no hepatomegaly or splenomegaly.  Musculoskeletal: Normal range of motion.     Right lower leg: No edema.     Left lower leg: No edema.  Lymphadenopathy:     Cervical: No cervical adenopathy.  Skin:    General: Skin is warm and dry.     Coloration: Skin is not pale.  Neurological:     General: No focal deficit present.     Mental Status: She is alert.  Psychiatric:        Mood and Affect: Mood normal.  Behavior: Behavior normal.     Lab Results  Component Value Date   WBC 13.5 (H) 08/10/2019   HGB 13.7 08/10/2019   HCT 40.9 08/10/2019   PLT 208.0 08/10/2019   GLUCOSE 92 08/10/2019   CHOL 155 07/25/2019   TRIG 189.0 (H) 07/25/2019   HDL 44.90 07/25/2019   LDLCALC 72 07/25/2019   ALT 14 07/25/2019   AST 18 07/25/2019   NA 137 08/10/2019   K 3.7 08/10/2019   CL 100 08/10/2019   CREATININE 0.83 08/10/2019   BUN 13 08/10/2019   CO2 28 08/10/2019   TSH 1.37 07/25/2019   HGBA1C 5.6 07/25/2019    No results found.  Assessment & Plan:   Kitty was seen today for urinary tract infection.  Diagnoses and all orders for this visit:  Acute cystitis with hematuria- Her dipstick  UA is positive for white blood cells and red blood cells.  Urine culture is pending.  I will empirically treat her with nitrofurantoin. -     POCT Urinalysis Dipstick (Automated) -     CULTURE, URINE COMPREHENSIVE; Future -     nitrofurantoin, macrocrystal-monohydrate, (MACROBID) 100 MG capsule; Take 1 capsule (100 mg total) by mouth 2 (two) times daily for 5 days.  Gross hematuria -     CBC with Differential; Future -     Basic metabolic panel; Future   I am having [name] start on nitrofurantoin (macrocrystal-monohydrate). I am also having her maintain her aspirin, triamcinolone cream, azelastine, and rosuvastatin.  Meds ordered this encounter  Medications  . nitrofurantoin, macrocrystal-monohydrate, (MACROBID) 100 MG capsule    Sig: Take 1 capsule (100 mg total) by mouth 2 (two) times daily for 5 days.    Dispense:  10 capsule    Refill:  0     Follow-up: Return if symptoms worsen or fail to improve.  Sanda Linger, MD

## 2019-08-12 LAB — CULTURE, URINE COMPREHENSIVE
MICRO NUMBER:: 1114649
SPECIMEN QUALITY:: ADEQUATE

## 2019-08-13 ENCOUNTER — Encounter: Payer: Self-pay | Admitting: Internal Medicine

## 2019-09-19 ENCOUNTER — Ambulatory Visit (INDEPENDENT_AMBULATORY_CARE_PROVIDER_SITE_OTHER): Payer: BC Managed Care – PPO | Admitting: Internal Medicine

## 2019-09-19 ENCOUNTER — Other Ambulatory Visit: Payer: Self-pay

## 2019-09-19 ENCOUNTER — Encounter: Payer: Self-pay | Admitting: Internal Medicine

## 2019-09-19 VITALS — BP 142/82 | HR 89 | Temp 97.9°F | Resp 16 | Ht 60.0 in | Wt 136.0 lb

## 2019-09-19 DIAGNOSIS — M25511 Pain in right shoulder: Secondary | ICD-10-CM | POA: Diagnosis not present

## 2019-09-19 DIAGNOSIS — M19011 Primary osteoarthritis, right shoulder: Secondary | ICD-10-CM | POA: Diagnosis not present

## 2019-09-19 DIAGNOSIS — G8929 Other chronic pain: Secondary | ICD-10-CM | POA: Insufficient documentation

## 2019-09-19 NOTE — Patient Instructions (Signed)
Shoulder Pain Many things can cause shoulder pain, including:  An injury to the shoulder.  Overuse of the shoulder.  Arthritis. The source of the pain can be:  Inflammation.  An injury to the shoulder joint.  An injury to a tendon, ligament, or bone. Follow these instructions at home: Pay attention to changes in your symptoms. Let your health care provider know about them. Follow these instructions to relieve your pain. If you have a sling:  Wear the sling as told by your health care provider. Remove it only as told by your health care provider.  Loosen the sling if your fingers tingle, become numb, or turn cold and blue.  Keep the sling clean.  If the sling is not waterproof: ? Do not let it get wet. Remove it to shower or bathe.  Move your arm as little as possible, but keep your hand moving to prevent swelling. Managing pain, stiffness, and swelling   If directed, put ice on the painful area: ? Put ice in a plastic bag. ? Place a towel between your skin and the bag. ? Leave the ice on for 20 minutes, 2-3 times per day. Stop applying ice if it does not help with the pain.  Squeeze a soft ball or a foam pad as much as possible. This helps to keep the shoulder from swelling. It also helps to strengthen the arm. General instructions  Take over-the-counter and prescription medicines only as told by your health care provider.  Keep all follow-up visits as told by your health care provider. This is important. Contact a health care provider if:  Your pain gets worse.  Your pain is not relieved with medicines.  New pain develops in your arm, hand, or fingers. Get help right away if:  Your arm, hand, or fingers: ? Tingle. ? Become numb. ? Become swollen. ? Become painful. ? Turn white or blue. Summary  Shoulder pain can be caused by an injury, overuse, or arthritis.  Pay attention to changes in your symptoms. Let your health care provider know about them.   This condition may be treated with a sling, ice, and pain medicines.  Contact your health care provider if the pain gets worse or new pain develops. Get help right away if your arm, hand, or fingers tingle or become numb, swollen, or painful.  Keep all follow-up visits as told by your health care provider. This is important. This information is not intended to replace advice given to you by your health care provider. Make sure you discuss any questions you have with your health care provider. Document Released: 06/18/2005 Document Revised: 03/23/2018 Document Reviewed: 03/23/2018 Elsevier Patient Education  2020 [employer]vier [employer]  

## 2019-09-19 NOTE — Progress Notes (Signed)
Subjective:  Patient ID: Beth Aguilar, female    DOB: August 17, 1956  Age: 63 y.o. MRN: [id number]  CC: Shoulder Pain  This visit occurred during the SARS-CoV-2 public health emergency.  Safety protocols were in place, including screening questions prior to the visit, additional usage of staff PPE, and extensive cleaning of exam room while observing appropriate contact time as indicated for disinfecting solutions.    HPI Beth Aguilar presents for a concern about her right shoulder.  She has a 45-month history of nontraumatic right shoulder pain and gradually decreasing range of motion.  She is adequately controlling the pain with Motrin.  Outpatient Medications Prior to Visit  Medication Sig Dispense Refill  . aspirin 81 MG tablet Take 1 tablet (81 mg total) by mouth daily. 100 tablet 99  . rosuvastatin (CRESTOR) 10 MG tablet TAKE 1 TABLET BY MOUTH DAILY. 90 tablet 3  . azelastine (ASTELIN) 0.1 % nasal spray PLACE 2 SPRAYS INTO EACH NOSTRIL 2 TIMES DAILY AS DIRECTED. 30 mL 2  . triamcinolone cream (KENALOG) 0.1 % Apply 1 application topically 2 (two) times daily. 100 g 0   No facility-administered medications prior to visit.    ROS Review of Systems  Constitutional: Negative for chills, fatigue and fever.  HENT: Negative.   Eyes: Negative for visual disturbance.  Respiratory: Negative for cough, chest tightness, shortness of breath and wheezing.   Cardiovascular: Negative for chest pain, palpitations and leg swelling.  Gastrointestinal: Negative for abdominal pain, constipation and diarrhea.  Endocrine: Negative.   Genitourinary: Negative.  Negative for difficulty urinating.  Musculoskeletal: Positive for arthralgias. Negative for myalgias and neck pain.  Skin: Negative for color change and pallor.  Neurological: Negative.  Negative for dizziness, weakness, light-headedness and headaches.  Hematological: Negative for adenopathy. Does not bruise/bleed easily.  Psychiatric/Behavioral:  Negative.     Objective:  BP (!) 142/82 (BP Location: Left Arm, Patient Position: Sitting, Cuff Size: Normal)   Pulse 89   Temp 97.9 F (36.6 C) (Oral)   Resp 16   Ht 5' (1.524 m)   Wt 136 lb (61.7 kg)   SpO2 99%   BMI 26.56 kg/m   BP Readings from Last 3 Encounters:  09/19/19 (!) 142/82  08/10/19 (!) 146/94  07/25/19 (!) 150/90    Wt Readings from Last 3 Encounters:  09/19/19 136 lb (61.7 kg)  08/10/19 130 lb 12 oz (59.3 kg)  07/25/19 132 lb (59.9 kg)    Physical Exam Vitals reviewed.  Constitutional:      Appearance: Normal appearance.  HENT:     Nose: Nose normal.  Eyes:     General: No scleral icterus.    Conjunctiva/sclera: Conjunctivae normal.  Cardiovascular:     Rate and Rhythm: Normal rate and regular rhythm.     Heart sounds: No murmur.  Pulmonary:     Effort: Pulmonary effort is normal.     Breath sounds: No stridor. No wheezing, rhonchi or rales.  Abdominal:     General: Abdomen is flat. Bowel sounds are normal.     Palpations: There is no hepatomegaly or splenomegaly.  Musculoskeletal:        General: Normal range of motion.     Right shoulder: No swelling, deformity, tenderness, bony tenderness or crepitus. Normal strength.     Cervical back: Neck supple.     Right lower leg: No edema.     Left lower leg: No edema.     Comments: multiple + abnormal rotator cuff sings in the  right shoulder  Lymphadenopathy:     Cervical: No cervical adenopathy.  Skin:    General: Skin is warm and dry.  Neurological:     General: No focal deficit present.     Mental Status: She is alert.  Psychiatric:        Mood and Affect: Mood normal.        Behavior: Behavior normal.     Lab Results  Component Value Date   WBC 13.5 (H) 08/10/2019   HGB 13.7 08/10/2019   HCT 40.9 08/10/2019   PLT 208.0 08/10/2019   GLUCOSE 92 08/10/2019   CHOL 155 07/25/2019   TRIG 189.0 (H) 07/25/2019   HDL 44.90 07/25/2019   LDLCALC 72 07/25/2019   ALT 14 07/25/2019   AST  18 07/25/2019   NA 137 08/10/2019   K 3.7 08/10/2019   CL 100 08/10/2019   CREATININE 0.83 08/10/2019   BUN 13 08/10/2019   CO2 28 08/10/2019   TSH 1.37 07/25/2019   HGBA1C 5.6 07/25/2019    DG Shoulder Right  Result Date: 09/20/2019 CLINICAL DATA:  Right shoulder pain for several months, no known injury, initial encounter EXAM: RIGHT SHOULDER - 2+ VIEW COMPARISON:  None. FINDINGS: Mild degenerative changes of the acromioclavicular joint are seen. No acute fracture or dislocation is noted. No soft tissue changes are seen. IMPRESSION: Mild degenerative change without acute abnormality. Electronically Signed   By: Alcide Clever M.D.   On: 09/20/2019 13:20    Assessment & Plan:   Avonne was seen today for shoulder pain.  Diagnoses and all orders for this visit:  Chronic right shoulder pain- Plain films show degenerative changes in the [facility] joint.  She will continue taking Motrin and I recommended that she start physical therapy.  If she continues to have discomfort then will consider referral to orthopedics. -     DG Shoulder Right; Future -     Ambulatory referral to Physical Therapy  Arthritis of right acromioclavicular joint- See above -     Ambulatory referral to Physical Therapy   I have discontinued [name] Weist's triamcinolone cream. I am also having her maintain her aspirin and rosuvastatin.  No orders of the defined types were placed in this encounter.    Follow-up: Return if symptoms worsen or fail to improve.  Sanda Linger, MD

## 2019-09-20 ENCOUNTER — Ambulatory Visit (INDEPENDENT_AMBULATORY_CARE_PROVIDER_SITE_OTHER)
Admission: RE | Admit: 2019-09-20 | Discharge: 2019-09-20 | Disposition: A | Discharge status: Home / Self Care | Payer: BC Managed Care – PPO | Source: Ambulatory Visit | Attending: Internal Medicine | Admitting: Internal Medicine

## 2019-09-20 ENCOUNTER — Encounter: Payer: Self-pay | Admitting: Internal Medicine

## 2019-09-20 ENCOUNTER — Other Ambulatory Visit: Payer: Self-pay | Admitting: Internal Medicine

## 2019-09-20 DIAGNOSIS — G8929 Other chronic pain: Secondary | ICD-10-CM | POA: Diagnosis not present

## 2019-09-20 DIAGNOSIS — M25511 Pain in right shoulder: Secondary | ICD-10-CM | POA: Diagnosis not present

## 2019-09-20 DIAGNOSIS — J302 Other seasonal allergic rhinitis: Secondary | ICD-10-CM

## 2019-09-20 DIAGNOSIS — M19011 Primary osteoarthritis, right shoulder: Secondary | ICD-10-CM | POA: Insufficient documentation

## 2019-10-12 ENCOUNTER — Other Ambulatory Visit: Payer: Self-pay

## 2019-10-12 ENCOUNTER — Encounter: Payer: Self-pay | Admitting: Physical Therapy

## 2019-10-12 ENCOUNTER — Ambulatory Visit: Payer: BC Managed Care – PPO | Admitting: Physical Therapy

## 2019-10-12 ENCOUNTER — Ambulatory Visit: Payer: BC Managed Care – PPO | Attending: Internal Medicine | Admitting: Physical Therapy

## 2019-10-12 DIAGNOSIS — M25511 Pain in right shoulder: Secondary | ICD-10-CM | POA: Diagnosis present

## 2019-10-12 DIAGNOSIS — G8929 Other chronic pain: Secondary | ICD-10-CM

## 2019-10-12 DIAGNOSIS — M25611 Stiffness of right shoulder, not elsewhere classified: Secondary | ICD-10-CM | POA: Diagnosis present

## 2019-10-12 DIAGNOSIS — M6281 Muscle weakness (generalized): Secondary | ICD-10-CM | POA: Diagnosis present

## 2019-10-12 NOTE — Therapy (Signed)
[facility] [address] [place], [place], [address] Phone: [phone]   Fax:  [phone]  Physical Therapy Evaluation  Patient Details  Name: Beth Aguilar MRN: [id number] Date of Birth: 1956-08-12 Referring Provider (PT): Etta Grandchild, MD   Encounter Date: 10/12/2019  PT End of Session - 10/12/19 1616    Visit Number  1    Number of Visits  8    Date for PT Re-Evaluation  12/07/19    Authorization Type  [employer]    PT Start Time  1602    PT Stop Time  1645    PT Time Calculation (min)  43 min    Activity Tolerance  Patient tolerated treatment well    Behavior During Therapy  [facility] for tasks assessed/performed       Past Medical History:  Diagnosis Date  . Kidney stone     Past Surgical History:  Procedure Laterality Date  . CESAREAN SECTION     x2  . JOINT REPLACEMENT    . TONSILLECTOMY      There were no vitals filed for this visit.   Subjective Assessment - 10/12/19 1605    Subjective  Patient reports in 2013 she had RTC surgery on left shoulder. Patient reports she has had right shoulder pain that has come and gone over the past few years and it typically occurs when she lifts something wrong. 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 She does have pain at night that will affect her sleep.    Limitations  Lifting;House hold activities    Diagnostic tests  X-ray - AC joint arthritis    Patient Stated Goals  Strengthen her shoulder to improve the pain and function.    Currently in Pain?  Yes    Pain Score  2    5/10 at worst   Pain Location  Shoulder    Pain Orientation  Right    Pain Descriptors / Indicators  Aching    Pain Type  Chronic pain    Pain Radiating Towards  lateral upper arm    Pain Onset  More than a month ago    Pain Frequency  Constant    Aggravating Factors   Lifting, moving the arm     Pain Relieving Factors  Medication, rest    Effect of Pain on Daily Activities  Patient is limited in her ability to lift objects with arm extended in front         [facility] PT Assessment - 10/12/19 0001      Assessment   Medical Diagnosis  Chronic right shoulder pain    Referring Provider (PT)  Etta Grandchild, MD    Onset Date/Surgical Date  --   worsened over past 3 months   Hand Dominance  Right    Next MD Visit  Not scheduled    Prior Therapy  Yes - left RTC repair      Precautions   Precautions  None      Restrictions   Weight Bearing Restrictions  No      Balance Screen   Has the patient fallen in the past 6 months  No    Has the patient had a decrease in activity level because of a fear of falling?   No    Is the patient reluctant to leave their home because  of a fear of falling?   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    Leisure  None      Cognition   Overall Cognitive Status  Within Functional Limits for tasks assessed      Observation/Other Assessments   Observations  Patient appears in no apparent distress    Focus on Therapeutic Outcomes (FOTO)   49% limitation      Sensation   Light Touch  Appears Intact      Posture/Postural Control   Posture Comments  Patient exhibits mild rounded shoulder posture      ROM / Strength   AROM / PROM / Strength  AROM;PROM;Strength      AROM   AROM Assessment Site  Shoulder    Right/Left Shoulder  Right;Left    Right Shoulder Flexion  160 Degrees    Right Shoulder ABduction  170 Degrees   mild painful arc ~90 deg   Right Shoulder Internal Rotation  --   L5   Right Shoulder External Rotation  45 Degrees   T2   Left Shoulder Flexion  160 Degrees    Left Shoulder ABduction  170  Degrees    Left Shoulder Internal Rotation  --   T12   Left Shoulder External Rotation  45 Degrees   45     PROM   PROM Assessment Site  Shoulder    Right/Left Shoulder  Right;Left    Right Shoulder Flexion  170 Degrees    Right Shoulder Internal Rotation  60 Degrees    Right Shoulder External Rotation  90 Degrees    Left Shoulder Flexion  170 Degrees    Left Shoulder Internal Rotation  60 Degrees    Left Shoulder External Rotation  90 Degrees      Strength   Overall Strength Comments  Periscapular strength grossly 4-/5 MMT bilat    Strength Assessment Site  Shoulder    Right/Left Shoulder  Left;Right    Right Shoulder Flexion  4/5    Right Shoulder ABduction  4/5    Right Shoulder Internal Rotation  5/5    Right Shoulder External Rotation  4/5    Left Shoulder Flexion  5/5    Left Shoulder ABduction  5/5    Left Shoulder Internal Rotation  5/5    Left Shoulder External Rotation  5/5      Palpation   Palpation comment  TTP over greater tuberosity and deltoid tuberosity      Special Tests    Special Tests  Rotator Cuff Impingement    Rotator Cuff Impingment tests  Neer impingement test      Neer Impingement test    Findings  Positive      Transfers   Transfers  Independent with all Transfers                Objective measurements completed on examination: See above findings.      [facility] Adult PT Treatment/Exercise - 10/12/19 0001      Exercises   Exercises  Shoulder      Shoulder Exercises: Standing   External Rotation  10 reps    Theraband Level (Shoulder External Rotation)  Level 2 (Red)  External Rotation Limitations  VC for technique and posture    Internal Rotation  10 reps    Theraband Level (Shoulder Internal Rotation)  Level 2 (Red)    Internal Rotation Limitations  VC for technique and posture    Extension  10 reps    Theraband Level (Shoulder Extension)  Level 2 (Red)    Extension Limitations  VC for technique, proper scapular mechanics     Row  10 reps    Theraband Level (Shoulder Row)  Level 2 (Red)    Row Limitations  VC for technique and proper scapular mechanics to avoid shrugs             PT Education - 10/12/19 1616    Education Details  Exam findings, shoulder anatomy and mechanisms of tendinopathy, POC, HEP, gradual strength progression    Person(s) Educated  Patient    Methods  Explanation;Demonstration;Tactile cues;Verbal cues;Handout    Comprehension  Verbalized understanding;Returned demonstration;Verbal cues required;Tactile cues required;Need further instruction       PT Short Term Goals - 10/12/19 1745      PT SHORT TERM GOAL #1   Title  Patient will be I with initial HEP to progress in PT    Time  4    Period  Weeks    Status  New    Target Date  11/09/19      PT SHORT TERM GOAL #2   Title  Patient will be able to reach a shelf that is at shoulder height with no difficulty    Time  4    Period  Weeks    Status  New    Target Date  11/09/19      PT SHORT TERM GOAL #3   Title  Patient will report </= 0-1/10 pain level at rest and </= 3-4/10 with shoulder activity to improve functional level.    Time  4    Period  Weeks    Status  New    Target Date  11/09/19        PT Long Term Goals - 10/12/19 1747      PT LONG TERM GOAL #1   Title  Patient will be I in final HEP to maintain progress made in PT.    Time  8    Period  Weeks    Status  New    Target Date  12/07/19      PT LONG TERM GOAL #2   Title  Patient will be able to place a 2 lb object in shelf overhead with no difficulty    Time  8    Period  Weeks    Status  New    Target Date  12/07/19      PT LONG TERM GOAL #3   Title  Patient will report </= 1-2/10 pain when lifting milk carton out of fridge with right arm.    Time  8    Period  Weeks    Status  New    Target Date  12/07/19      PT LONG TERM GOAL #4   Title  Patient will exhibit improved functional level by </= 34% limitation    Time  8    Period  Weeks     Status  New    Target Date  12/07/19             Plan - 10/12/19 1617    Clinical Impression Statement  Patient presents to PT  with report of chronic right shoulder pain that seems consistent with rotator cuff tendinopathy. She exhibits good range of motion but has painful arc with abduction, limited and painful reaching behind back, decreased strength of rotator cuff and periscapular musculature with painful rotator cuff testing. She was provided with exercises to initiate strengthening and educated on mechanisms of tendinopathy and gradual progression of strengthening and activity. She would benefit from continued skilled PT to progress her strength and activity tolerance to improve lifting ability and return to previous level of function.    Personal Factors and Comorbidities  Time since onset of injury/illness/exacerbation    Examination-Activity Limitations  Lift;Sleep;Reach Overhead    Examination-Participation Restrictions  Cleaning;Community Activity;School    Stability/Clinical Decision Making  Stable/Uncomplicated    Clinical Decision Making  Low    Rehab Potential  Good    PT Frequency  1x / week    PT Duration  8 weeks    PT Treatment/Interventions  ADLs/Self Care Home Management;Cryotherapy;Electrical Stimulation;Moist Heat;Neuromuscular re-education;Therapeutic exercise;Therapeutic activities;Patient/family education;Manual techniques;Dry needling;Passive range of motion;Taping;Spinal Manipulations;Joint Manipulations    PT Next Visit Plan  Assess HEP and progress as PRN, rotator cuff and periscapular strengthening, manual PRN    PT Home Exercise Plan  Banded row, extension, ER, IR with red    Consulted and Agree with Plan of Care  Patient       Patient will benefit from skilled therapeutic intervention in order to improve the following deficits and impairments:  Decreased range of motion, Decreased activity tolerance, Pain, Postural dysfunction, Decreased  strength  Visit Diagnosis: Chronic right shoulder pain  Muscle weakness (generalized)  Stiffness of right shoulder, not elsewhere classified     Problem List Patient Active Problem List   Diagnosis Date Noted  . Arthritis of right acromioclavicular joint 09/20/2019  . Chronic right shoulder pain 09/19/2019  . Acute cystitis with hematuria 08/10/2019  . Gross hematuria 08/10/2019  . Polyarthralgia 07/25/2019  . Leg pain 06/25/2018  . Osteopenia 06/10/2017  . Rash 08/11/2015  . Other seasonal allergic rhinitis 05/08/2015  . Hyperlipidemia 11/04/2014  . Routine general medical examination at a health care facility 11/04/2014    Rosana Hoes, PT, DPT, LAT, ATC 10/12/19  5:51 PM Phone: [phone] Fax: [phone]   [facility] Outpatient Rehabilitation [facility] [address] [place], [place], [address] Phone: [phone]   Fax:  [phone]  Name: Beth Aguilar MRN: [id number] Date of Birth: Sep 01, 1956

## 2019-10-12 NOTE — Patient Instructions (Signed)
Access Code: [email or web address]  URL: [email or web address]  Date: 10/12/2019  Prepared by: Rosana Hoes   Exercises Standing Row with Anchored Resistance - 10 reps - 1x daily - 7x weekly Shoulder Extension with Band - 10 reps - 1x daily - 7x weekly Shoulder External Rotation with Anchored Resistance - 10 reps - 1x daily - 7x weekly Shoulder Internal Rotation with Resistance - 10 reps - 1x daily - 7x weekly

## 2019-10-24 ENCOUNTER — Other Ambulatory Visit: Payer: Self-pay

## 2019-10-24 ENCOUNTER — Encounter: Payer: Self-pay | Admitting: Physical Therapy

## 2019-10-24 ENCOUNTER — Ambulatory Visit: Payer: BC Managed Care – PPO | Attending: Internal Medicine | Admitting: Physical Therapy

## 2019-10-24 DIAGNOSIS — G8929 Other chronic pain: Secondary | ICD-10-CM

## 2019-10-24 DIAGNOSIS — M25511 Pain in right shoulder: Secondary | ICD-10-CM | POA: Insufficient documentation

## 2019-10-24 DIAGNOSIS — M25611 Stiffness of right shoulder, not elsewhere classified: Secondary | ICD-10-CM | POA: Diagnosis present

## 2019-10-24 DIAGNOSIS — M6281 Muscle weakness (generalized): Secondary | ICD-10-CM

## 2019-10-24 NOTE — Therapy (Signed)
[facility] Outpatient Rehabilitation [facility] [address] [place], [place], [address] Phone: [phone]   Fax:  [phone]  Physical Therapy Treatment  Patient Details  Name: Beth Aguilar MRN: [id number] Date of Birth: October 05, 1955 Referring Provider (PT): Etta Grandchild, MD   Encounter Date: 10/24/2019  PT End of Session - 10/24/19 1656    Visit Number  2    Number of Visits  8    Date for PT Re-Evaluation  12/07/19    Authorization Type  BCBS    PT Start Time  1615    PT Stop Time  1655    PT Time Calculation (min)  40 min    Activity Tolerance  Patient tolerated treatment well    Behavior During Therapy  [facility] for tasks assessed/performed       Past Medical History:  Diagnosis Date  . Kidney stone     Past Surgical History:  Procedure Laterality Date  . CESAREAN SECTION     x2  . JOINT REPLACEMENT    . TONSILLECTOMY      There were no vitals filed for this visit.  Subjective Assessment - 10/24/19 1629    Subjective  Patient reports she has trouble with some of the exercises. She has pain mainly when she lifts her arms overhead or reaches behind her back.    Patient Stated Goals  Strengthen her shoulder to improve the pain and function.    Currently in Pain?  Yes    Pain Score  0-No pain   3.5/10 at worst   Pain Location  Shoulder    Pain Orientation  Right    Pain Descriptors / Indicators  Aching    Pain Type  Chronic pain    Pain Onset  More than a month ago    Pain Frequency  Intermittent                       [facility] Adult PT Treatment/Exercise - 10/24/19 0001      Exercises   Exercises  Shoulder      Shoulder Exercises: Supine   Protraction  10 reps    Flexion  Strengthening;10 reps    Shoulder Flexion Weight (lbs)  1      Shoulder Exercises: Standing   External Rotation  10 reps    Theraband Level (Shoulder External Rotation)  Level 2 (Red)    Internal Rotation  10 reps    Theraband Level (Shoulder Internal  Rotation)  Level 2 (Red)    Flexion  10 reps    Shoulder Flexion Weight (lbs)  1    Extension  10 reps    Theraband Level (Shoulder Extension)  Level 2 (Red)    Row  10 reps    Theraband Level (Shoulder Row)  Level 2 (Red)      Shoulder Exercises: ROM/Strengthening   UBE (Upper Arm Bike)  L1 x4 min (fwd/bwd)             PT Education - 10/24/19 1654    Education Details  HEP    Person(s) Educated  Patient    Methods  Explanation;Demonstration;Verbal cues;Handout    Comprehension  Verbalized understanding;Returned demonstration;Verbal cues required;Need further instruction       PT Short Term Goals - 10/12/19 1745      PT SHORT TERM GOAL #1   Title  Patient will be I with initial HEP to progress in PT    Time  4  Period  Weeks    Status  New    Target Date  11/09/19      PT SHORT TERM GOAL #2   Title  Patient will be able to reach a shelf that is at shoulder height with no difficulty    Time  4    Period  Weeks    Status  New    Target Date  11/09/19      PT SHORT TERM GOAL #3   Title  Patient will report </= 0-1/10 pain level at rest and </= 3-4/10 with shoulder activity to improve functional level.    Time  4    Period  Weeks    Status  New    Target Date  11/09/19        PT Long Term Goals - 10/12/19 1747      PT LONG TERM GOAL #1   Title  Patient will be I in final HEP to maintain progress made in PT.    Time  8    Period  Weeks    Status  New    Target Date  12/07/19      PT LONG TERM GOAL #2   Title  Patient will be able to place a 2 lb object in shelf overhead with no difficulty    Time  8    Period  Weeks    Status  New    Target Date  12/07/19      PT LONG TERM GOAL #3   Title  Patient will report </= 1-2/10 pain when lifting milk carton out of fridge with right arm.    Time  8    Period  Weeks    Status  New    Target Date  12/07/19      PT LONG TERM GOAL #4   Title  Patient will exhibit improved functional level by </= 34%  limitation    Time  8    Period  Weeks    Status  New    Target Date  12/07/19            Plan - 10/24/19 1657    Clinical Impression Statement  Patient required cueing for proper form with exercises and HEP. She did tolerate light progression in rotator cuff strengthening this visit without increased pain level. She was encouraged to be consistent with her exercises and take a day off if her pain level was increased. She would benefit from continued skilled PT to progress her activity tolerance and ability to use arm overhead without limitation.    PT Treatment/Interventions  ADLs/Self Care Home Management;Cryotherapy;Electrical Stimulation;Moist Heat;Neuromuscular re-education;Therapeutic exercise;Therapeutic activities;Patient/family education;Manual techniques;Dry needling;Passive range of motion;Taping;Spinal Manipulations;Joint Manipulations    PT Next Visit Plan  Assess HEP and progress as PRN, rotator cuff and periscapular strengthening, manual PRN    PT Home Exercise Plan  Banded row, extension, ER, IR with red, supine flexion with 1-2 lbs, supine scap protraction    Consulted and Agree with Plan of Care  Patient       Patient will benefit from skilled therapeutic intervention in order to improve the following deficits and impairments:  Decreased range of motion, Decreased activity tolerance, Pain, Postural dysfunction, Decreased strength  Visit Diagnosis: Chronic right shoulder pain  Muscle weakness (generalized)  Stiffness of right shoulder, not elsewhere classified     Problem List Patient Active Problem List   Diagnosis Date Noted  . Arthritis of right acromioclavicular joint 09/20/2019  .  Chronic right shoulder pain 09/19/2019  . Acute cystitis with hematuria 08/10/2019  . Gross hematuria 08/10/2019  . Polyarthralgia 07/25/2019  . Leg pain 06/25/2018  . Osteopenia 06/10/2017  . Rash 08/11/2015  . Other seasonal allergic rhinitis 05/08/2015  . Hyperlipidemia  11/04/2014  . Routine general medical examination at a health care facility 11/04/2014    Rosana Hoes, PT, DPT, LAT, ATC 10/24/19  5:05 PM Phone: [phone] Fax: [phone]   [facility] Outpatient Rehabilitation [facility] [address] [place], [place], [address] Phone: [phone]   Fax:  [phone]  Name: Beth Aguilar MRN: [id number] Date of Birth: 01-10-1956

## 2019-10-31 ENCOUNTER — Ambulatory Visit: Payer: BC Managed Care – PPO

## 2019-10-31 ENCOUNTER — Other Ambulatory Visit: Payer: Self-pay

## 2019-10-31 DIAGNOSIS — M25611 Stiffness of right shoulder, not elsewhere classified: Secondary | ICD-10-CM

## 2019-10-31 DIAGNOSIS — G8929 Other chronic pain: Secondary | ICD-10-CM

## 2019-10-31 DIAGNOSIS — M25511 Pain in right shoulder: Secondary | ICD-10-CM

## 2019-10-31 DIAGNOSIS — M6281 Muscle weakness (generalized): Secondary | ICD-10-CM

## 2019-10-31 NOTE — Therapy (Addendum)
[place], [place], [address] Phone: [phone]   Fax:  [phone]  Physical Therapy Treatment/Discharge  Patient Details  Name: Beth Aguilar MRN: [id number] Date of Birth: 12-03-1955 Referring Provider (PT): Janith Lima, MD   Encounter Date: 10/31/2019  PT End of Session - 10/31/19 1534    Visit Number  3    Number of Visits  8    Date for PT Re-Evaluation  12/07/19    Authorization Type  BCBS    PT Start Time  0333    PT Stop Time  0415    PT Time Calculation (min)  42 min    Activity Tolerance  Patient tolerated treatment well    Behavior During Therapy  [facility] for tasks assessed/performed       Past Medical History:  Diagnosis Date  . Kidney stone     Past Surgical History:  Procedure Laterality Date  . CESAREAN SECTION     x2  . JOINT REPLACEMENT    . TONSILLECTOMY      There were no vitals filed for this visit.  Subjective Assessment - 10/31/19 1541    Subjective  No pain.   she reports shoulder is about the same. She can do exercises  3x/week. due to being busy,  Husband thinks pain relates to lifting bag. Needs to be lifted into back seat.    Patient Stated Goals  Strengthen her shoulder to improve the pain and function.    Currently in Pain?  No/denies                       [facility] Adult PT Treatment/Exercise - 10/31/19 0001      Shoulder Exercises: Supine   Protraction  Right;20 reps    Protraction Weight (lbs)  3    Horizontal ABduction  Right;20 reps    Horizontal ABduction Weight (lbs)  3      Shoulder Exercises: Sidelying   External Rotation  Right;20 reps    External Rotation Weight (lbs)  3    Other Sidelying Exercises  hor abduction      Shoulder Exercises: Standing   External Rotation  Right;20 reps    Theraband Level (Shoulder External Rotation)  Level 2 (Red)    Internal Rotation  Right;20 reps    Extension  Right;20 reps    Theraband Level  (Shoulder Extension)  Level 2 (Red)    Row  Right;20 reps    Theraband Level (Shoulder Row)  Level 2 (Red)     UBE L2 6 min        PT Education - 10/31/19 1718    Education Details  HEP    Person(s) Educated  Patient    Methods  Explanation;Demonstration;Tactile cues;Verbal cues;Handout    Comprehension  Returned demonstration;Verbalized understanding       PT Short Term Goals - 10/31/19 1723      PT SHORT TERM GOAL #1   Title  Patient will be I with initial HEP to progress in PT    Status  Achieved        PT Long Term Goals - 10/12/19 1747      PT LONG TERM GOAL #1   Title  Patient will be I in final HEP to maintain progress made in PT.    Time  8    Period  Weeks    Status  New    Target Date  12/07/19  PT LONG TERM GOAL #2   Title  Patient will be able to place a 2 lb object in shelf overhead with no difficulty    Time  8    Period  Weeks    Status  New    Target Date  12/07/19      PT LONG TERM GOAL #3   Title  Patient will report </= 1-2/10 pain when lifting milk carton out of fridge with right arm.    Time  8    Period  Weeks    Status  New    Target Date  12/07/19      PT LONG TERM GOAL #4   Title  Patient will exhibit improved functional level by </= 34% limitation    Time  8    Period  Weeks    Status  New    Target Date  12/07/19            Plan - 10/31/19 1719    Clinical Impression Statement  Tolerated all exercises . Advised to try to lift bags into back of car reather than back seat.  Weigh bag so we can know what to work with.    PT Treatment/Interventions  ADLs/Self Care Home Management;Cryotherapy;Electrical Stimulation;Moist Heat;Neuromuscular re-education;Therapeutic exercise;Therapeutic activities;Patient/family education;Manual techniques;Dry needling;Passive range of motion;Taping;Spinal Manipulations;Joint Manipulations    PT Home Exercise Plan  Banded row, extension, ER, IR with red, supine flexion with 1-2 lbs, supine  scap protraction  supine hor add, side hor abduct . isometrics abduction    Consulted and Agree with Plan of Care  Patient       Patient will benefit from skilled therapeutic intervention in order to improve the following deficits and impairments:  Decreased range of motion, Decreased activity tolerance, Pain, Postural dysfunction, Decreased strength  Visit Diagnosis: Chronic right shoulder pain  Muscle weakness (generalized)  Stiffness of right shoulder, not elsewhere classified     Problem List Patient Active Problem List   Diagnosis Date Noted  . Arthritis of right acromioclavicular joint 09/20/2019  . Chronic right shoulder pain 09/19/2019  . Acute cystitis with hematuria 08/10/2019  . Gross hematuria 08/10/2019  . Polyarthralgia 07/25/2019  . Leg pain 06/25/2018  . Osteopenia 06/10/2017  . Rash 08/11/2015  . Other seasonal allergic rhinitis 05/08/2015  . Hyperlipidemia 11/04/2014  . Routine general medical examination at a health care facility 11/04/2014    Darrel Hoover  PT 10/31/2019, 5:24 PM  [facility] [address] [place], [place], [address] Phone: [phone]   Fax:  [phone]  Name: Beth Aguilar MRN: [id number] Date of Birth: 1956-04-05  PHYSICAL THERAPY DISCHARGE SUMMARY  Visits from Start of Care: 3  Current functional level related to goals / functional outcomes: Unknown as she canceled last 3 sessions due to conflicts and did not return after that   Remaining deficits: Unknown   Education / Equipment: HEP  Plan:                                                    Patient goals were not met. Patient is being discharged due to not returning since the last visit.  ?????    Pearson Forster  PT  01/03/20

## 2019-10-31 NOTE — Patient Instructions (Addendum)
Supine horizontal adduction , side lye hor abduction.  Isometric abduction   10-20 reps as able 5-10 sec hold (isometrics)

## 2019-11-07 ENCOUNTER — Ambulatory Visit: Payer: BC Managed Care – PPO | Admitting: Physical Therapy

## 2019-11-14 ENCOUNTER — Ambulatory Visit: Payer: BC Managed Care – PPO

## 2019-11-19 ENCOUNTER — Ambulatory Visit: Payer: BC Managed Care – PPO | Attending: Internal Medicine

## 2019-11-19 DIAGNOSIS — Z23 Encounter for immunization: Secondary | ICD-10-CM

## 2019-11-19 NOTE — Progress Notes (Signed)
   Covid-19 Vaccination Clinic  Name:  Markiya Keefe    MRN: [id number] DOB: [birth year]  11/19/2019  Ms. Gathright was observed post Covid-19 immunization for 15 minutes without incidence. She was provided with Vaccine Information Sheet and instruction to access the V-Safe system.   Ms. Goodie was instructed to call 911 with any severe reactions post vaccine: Marland Kitchen Difficulty breathing  . Swelling of your face and throat  . A fast heartbeat  . A bad rash all over your body  . Dizziness and weakness    Immunizations Administered    Name Date Dose VIS Date Route   Pfizer COVID-19 Vaccine 11/19/2019  5:26 PM 0.3 mL 09/02/2019 Intramuscular   Manufacturer: [employer], [employer]   Lot: [id number]   NDC: [address]-3

## 2019-11-22 ENCOUNTER — Other Ambulatory Visit: Payer: Self-pay

## 2019-11-22 ENCOUNTER — Ambulatory Visit (INDEPENDENT_AMBULATORY_CARE_PROVIDER_SITE_OTHER): Payer: BC Managed Care – PPO | Admitting: Internal Medicine

## 2019-11-22 VITALS — BP 140/82 | HR 77 | Temp 98.0°F | Ht 60.0 in | Wt 131.0 lb

## 2019-11-22 DIAGNOSIS — Z719 Counseling, unspecified: Secondary | ICD-10-CM | POA: Diagnosis not present

## 2019-11-22 NOTE — Progress Notes (Signed)
   Subjective:   Patient ID: Beth Aguilar, female    DOB: [birth year], 64 y.o.   MRN: [id number]  HPI The patient is a 64 YO female coming in for discussion about letter and labs received from [facility] after donating blood. She had false positive screening HIV but confirmatory test negative. She denies any recent exposure and husband gave blood at same time and negative. 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 Feels well overall.   Review of Systems  Constitutional: Negative.   HENT: Negative.   Eyes: Negative.   Respiratory: Negative for cough, chest tightness and shortness of breath.   Cardiovascular: Negative for chest pain, palpitations and leg swelling.  Gastrointestinal: Negative for abdominal distention, abdominal pain, constipation, diarrhea, nausea and vomiting.  Musculoskeletal: Negative.   Skin: Negative.   Neurological: Negative.   Psychiatric/Behavioral: Negative.     Objective:  Physical Exam Constitutional:      Appearance: She is well-developed.  HENT:     Head: Normocephalic and atraumatic.  Cardiovascular:     Rate and Rhythm: Normal rate and regular rhythm.  Pulmonary:     Effort: Pulmonary effort is normal. No respiratory distress.     Breath sounds: Normal breath sounds. No wheezing or rales.  Abdominal:     General: Bowel sounds are normal. There is no distension.     Palpations: Abdomen is soft.     Tenderness: There is no abdominal tenderness. There is no rebound.  Musculoskeletal:     Cervical back: Normal range of motion.  Skin:    General: Skin is warm and dry.  Neurological:     Mental Status: She is alert and oriented to person, place, and time.     Coordination: Coordination normal.     Vitals:   11/22/19 1610  BP: 140/82  Pulse: 77  Temp: 98 F (36.7 C)  TempSrc: Oral  SpO2: 97%  Weight: 131 lb (59.4 kg)  Height: 5' (1.524 m)    This visit occurred during the SARS-CoV-2 public health emergency.  Safety  protocols were in place, including screening questions prior to the visit, additional usage of staff PPE, and extensive cleaning of exam room while observing appropriate contact time as indicated for disinfecting solutions.   Assessment & Plan:

## 2019-11-24 ENCOUNTER — Encounter: Payer: Self-pay | Admitting: Internal Medicine

## 2019-11-24 DIAGNOSIS — Z719 Counseling, unspecified: Secondary | ICD-10-CM | POA: Insufficient documentation

## 2019-11-24 NOTE — Assessment & Plan Note (Signed)
Advised that she can have repeat testing if she is concerned but does not need this. Typical screening tests have higher false positive rates to accommodate the need for very low false negative rates for blood products. Confirmatory testing was negative which means she does not have HIV.

## 2019-12-10 ENCOUNTER — Ambulatory Visit: Payer: BC Managed Care – PPO | Attending: Internal Medicine

## 2019-12-10 DIAGNOSIS — Z23 Encounter for immunization: Secondary | ICD-10-CM

## 2019-12-10 NOTE — Progress Notes (Signed)
   Covid-19 Vaccination Clinic  Name:  Satya Bohall    MRN: [id number] DOB: 02/06/1956  12/10/2019  Ms. Vanderpool was observed post Covid-19 immunization for 15 minutes without incident. She was provided with Vaccine Information Sheet and instruction to access the V-Safe system.   Ms. Verhagen was instructed to call 911 with any severe reactions post vaccine: Marland Kitchen Difficulty breathing  . Swelling of face and throat  . A fast heartbeat  . A bad rash all over body  . Dizziness and weakness   Immunizations Administered    Name Date Dose VIS Date Route   Pfizer COVID-19 Vaccine 12/10/2019  9:10 AM 0.3 mL 09/02/2019 Intramuscular   Manufacturer: [employer], [employer]   Lot: [id number]   NDC: [address]-9

## 2020-01-29 ENCOUNTER — Encounter: Payer: Self-pay | Admitting: Internal Medicine

## 2020-05-04 ENCOUNTER — Other Ambulatory Visit: Payer: Self-pay

## 2020-05-04 ENCOUNTER — Ambulatory Visit (INDEPENDENT_AMBULATORY_CARE_PROVIDER_SITE_OTHER): Payer: BC Managed Care – PPO | Admitting: Internal Medicine

## 2020-05-04 ENCOUNTER — Encounter: Payer: Self-pay | Admitting: Internal Medicine

## 2020-05-04 VITALS — BP 126/84 | HR 82 | Temp 98.1°F | Ht 60.0 in | Wt 128.0 lb

## 2020-05-04 DIAGNOSIS — E782 Mixed hyperlipidemia: Secondary | ICD-10-CM | POA: Diagnosis not present

## 2020-05-04 DIAGNOSIS — Z0000 Encounter for general adult medical examination without abnormal findings: Secondary | ICD-10-CM

## 2020-05-04 LAB — CBC
HCT: 41.5 % (ref 35.0–45.0)
Hemoglobin: 14.1 g/dL (ref 11.7–15.5)
MCH: 31.5 pg (ref 27.0–33.0)
MCHC: 34 g/dL (ref 32.0–36.0)
MCV: 92.8 fL (ref 80.0–100.0)
MPV: 10.4 fL (ref 7.5–12.5)
Platelets: 202 10*3/uL (ref 140–400)
RBC: 4.47 10*6/uL (ref 3.80–5.10)
RDW: 13.1 % (ref 11.0–15.0)
WBC: 8.5 10*3/uL (ref 3.8–10.8)

## 2020-05-04 LAB — COMPREHENSIVE METABOLIC PANEL WITH GFR
AG Ratio: 2 (calc) (ref 1.0–2.5)
ALT: 17 U/L (ref 6–29)
AST: 20 U/L (ref 10–35)
Albumin: 4.4 g/dL (ref 3.6–5.1)
Alkaline phosphatase (APISO): 51 U/L (ref 37–153)
BUN: 17 mg/dL (ref 7–25)
CO2: 27 mmol/L (ref 20–32)
Calcium: 9.4 mg/dL (ref 8.6–10.4)
Chloride: 103 mmol/L (ref 98–110)
Creat: 0.92 mg/dL (ref 0.50–0.99)
Globulin: 2.2 g/dL (ref 1.9–3.7)
Glucose, Bld: 83 mg/dL (ref 65–99)
Potassium: 4.2 mmol/L (ref 3.5–5.3)
Sodium: 139 mmol/L (ref 135–146)
Total Bilirubin: 0.7 mg/dL (ref 0.2–1.2)
Total Protein: 6.6 g/dL (ref 6.1–8.1)

## 2020-05-04 LAB — LIPID PANEL
Cholesterol: 165 mg/dL (ref <200)
HDL: 56 mg/dL (ref >=50)
LDL Cholesterol (Calc): 90 mg/dL (calc)
Non-HDL Cholesterol (Calc): 109 mg/dL (calc) (ref <130)
Total CHOL/HDL Ratio: 2.9 (calc) (ref <5.0)
Triglycerides: 101 mg/dL (ref <150)

## 2020-05-04 MED ORDER — ROSUVASTATIN CALCIUM 10 MG PO TABS: 10.0000 mg | ORAL_TABLET | Freq: Every day | ORAL | 3 refills | Status: DC

## 2020-05-04 NOTE — Progress Notes (Signed)
   Subjective:   Patient ID: Beth Aguilar, female    DOB: Jul 22, 1956, 64 y.o.   MRN: [id number]  HPI The patient is a 64 YO female coming in for physical.   PMH, FMH, social history reviewed and updated  Review of Systems  Constitutional: Negative.   HENT: Negative.   Eyes: Negative.   Respiratory: Negative for cough, chest tightness and shortness of breath.   Cardiovascular: Negative for chest pain, palpitations and leg swelling.  Gastrointestinal: Negative for abdominal distention, abdominal pain, constipation, diarrhea, nausea and vomiting.  Musculoskeletal: Negative.   Skin: Negative.   Neurological: Negative.   Psychiatric/Behavioral: Negative.     Objective:  Physical Exam Constitutional:      Appearance: She is well-developed.  HENT:     Head: Normocephalic and atraumatic.  Cardiovascular:     Rate and Rhythm: Normal rate and regular rhythm.  Pulmonary:     Effort: Pulmonary effort is normal. No respiratory distress.     Breath sounds: Normal breath sounds. No wheezing or rales.  Abdominal:     General: Bowel sounds are normal. There is no distension.     Palpations: Abdomen is soft.     Tenderness: There is no abdominal tenderness. There is no rebound.  Musculoskeletal:     Cervical back: Normal range of motion.  Skin:    General: Skin is warm and dry.  Neurological:     Mental Status: She is alert and oriented to person, place, and time.     Coordination: Coordination normal.     Vitals:   05/04/20 1047  BP: 126/84  Pulse: 82  Temp: 98.1 F (36.7 C)  TempSrc: Oral  SpO2: 98%  Weight: 135 lb (61.2 kg)  Height: 5' (1.524 m)   EKG: Rate 65, axis normal, intervals normal, sinus, no st or t wave changes, no prior to compare  This visit occurred during the SARS-CoV-2 public health emergency.  Safety protocols were in place, including screening questions prior to the visit, additional usage of staff PPE, and extensive cleaning of exam room while observing  appropriate contact time as indicated for disinfecting solutions.   Assessment & Plan:

## 2020-05-04 NOTE — Assessment & Plan Note (Signed)
Checking lipid panel, diet controlled.  

## 2020-05-04 NOTE — Assessment & Plan Note (Signed)
Flu shot yearly. Covid-19 up to date. Shingrix counseled declines today. Tetanus declines. Colonoscopy up to date. Mammogram up to date, pap smear up to date. Counseled about sun safety and mole surveillance. Counseled about the dangers of distracted driving. Given 10 year screening recommendations.

## 2020-05-04 NOTE — Patient Instructions (Addendum)
The EKG of the heart looks normal, you can try magnesium daily to see if this helps. Think about getting the shingles vaccine.  Health Maintenance, Female Adopting a healthy lifestyle and getting preventive care are important in promoting health and wellness. Ask your health care provider about:  The right schedule for you to have regular tests and exams.  Things you can do on your own to prevent diseases and keep yourself healthy. What should I know about diet, weight, and exercise? Eat a healthy diet   Eat a diet that includes plenty of vegetables, fruits, low-fat dairy products, and lean protein.  Do not eat a lot of foods that are high in solid fats, added sugars, or sodium. Maintain a healthy weight Body mass index (BMI) is used to identify weight problems. It estimates body fat based on height and weight. Your health care provider can help determine your BMI and help you achieve or maintain a healthy weight. Get regular exercise Get regular exercise. This is one of the most important things you can do for your health. Most adults should:  Exercise for at least 150 minutes each week. The exercise should increase your heart rate and make you sweat (moderate-intensity exercise).  Do strengthening exercises at least twice a week. This is in addition to the moderate-intensity exercise.  Spend less time sitting. Even light physical activity can be beneficial. Watch cholesterol and blood lipids Have your blood tested for lipids and cholesterol at 63 years of age, then have this test every 5 years. Have your cholesterol levels checked more often if:  Your lipid or cholesterol levels are high.  You are older than 63 years of age.  You are at high risk for heart disease. What should I know about cancer screening? Depending on your health history and family history, you may need to have cancer screening at various ages. This may include screening for:  Breast cancer.  Cervical  cancer.  Colorectal cancer.  Skin cancer.  Lung cancer. What should I know about heart disease, diabetes, and high blood pressure? Blood pressure and heart disease  High blood pressure causes heart disease and increases the risk of stroke. This is more likely to develop in people who have high blood pressure readings, are of African descent, or are overweight.  Have your blood pressure checked: ? Every 3-5 years if you are 44-46 years of age. ? Every year if you are 49 years old or older. Diabetes Have regular diabetes screenings. This checks your fasting blood sugar level. Have the screening done:  Once every three years after age 36 if you are at a normal weight and have a low risk for diabetes.  More often and at a younger age if you are overweight or have a high risk for diabetes. What should I know about preventing infection? Hepatitis B If you have a higher risk for hepatitis B, you should be screened for this virus. Talk with your health care provider to find out if you are at risk for hepatitis B infection. Hepatitis C Testing is recommended for:  Everyone born from [birth year] through 1964.  Anyone with known risk factors for hepatitis C. Sexually transmitted infections (STIs)  Get screened for STIs, including gonorrhea and chlamydia, if: ? You are sexually active and are younger than 63 years of age. ? You are older than 63 years of age and your health care provider tells you that you are at risk for this type of infection. ? Your sexual  activity has changed since you were last screened, and you are at increased risk for chlamydia or gonorrhea. Ask your health care provider if you are at risk.  Ask your health care provider about whether you are at high risk for HIV. Your health care provider may recommend a prescription medicine to help prevent HIV infection. If you choose to take medicine to prevent HIV, you should first get tested for HIV. You should then be tested every 3  months for as long as you are taking the medicine. Pregnancy  If you are about to stop having your period (premenopausal) and you may become pregnant, seek counseling before you get pregnant.  Take 400 to 800 micrograms (mcg) of folic acid every day if you become pregnant.  Ask for birth control (contraception) if you want to prevent pregnancy. Osteoporosis and menopause Osteoporosis is a disease in which the bones lose minerals and strength with aging. This can result in bone fractures. If you are 30 years old or older, or if you are at risk for osteoporosis and fractures, ask your health care provider if you should:  Be screened for bone loss.  Take a calcium or vitamin D supplement to lower your risk of fractures.  Be given hormone replacement therapy (HRT) to treat symptoms of menopause. Follow these instructions at home: Lifestyle  Do not use any products that contain nicotine or tobacco, such as cigarettes, e-cigarettes, and chewing tobacco. If you need help quitting, ask your health care provider.  Do not use street drugs.  Do not share needles.  Ask your health care provider for help if you need support or information about quitting drugs. Alcohol use  Do not drink alcohol if: ? Your health care provider tells you not to drink. ? You are pregnant, may be pregnant, or are planning to become pregnant.  If you drink alcohol: ? Limit how much you use to 0-1 drink a day. ? Limit intake if you are breastfeeding.  Be aware of how much alcohol is in your drink. In the [place] one drink equals one 12 oz bottle of beer (355 mL), one 5 oz glass of wine (148 mL), or one 1 oz glass of hard liquor (44 mL). General instructions  Schedule regular health, dental, and eye exams.  Stay current with your vaccines.  Tell your health care provider if: ? You often feel depressed. ? You have ever been abused or do not feel safe at home. Summary  Adopting a healthy lifestyle and getting  preventive care are important in promoting health and wellness.  Follow your health care provider's instructions about healthy diet, exercising, and getting tested or screened for diseases.  Follow your health care provider's instructions on monitoring your cholesterol and blood pressure. This information is not intended to replace advice given to you by your health care provider. Make sure you discuss any questions you have with your health care provider. Document Revised: 09/01/2018 Document Reviewed: 09/01/2018 Elsevier Patient Education  2020 [employer].

## 2020-06-08 ENCOUNTER — Other Ambulatory Visit: Payer: Self-pay | Admitting: Obstetrics & Gynecology

## 2020-06-08 DIAGNOSIS — N63 Unspecified lump in unspecified breast: Secondary | ICD-10-CM

## 2020-06-20 ENCOUNTER — Ambulatory Visit
Admission: RE | Admit: 2020-06-20 | Discharge: 2020-06-20 | Disposition: A | Discharge status: Home / Self Care | Payer: BC Managed Care – PPO | Source: Ambulatory Visit | Attending: Obstetrics & Gynecology | Admitting: Obstetrics & Gynecology

## 2020-06-20 ENCOUNTER — Other Ambulatory Visit: Payer: Self-pay

## 2020-06-20 DIAGNOSIS — N63 Unspecified lump in unspecified breast: Secondary | ICD-10-CM

## 2020-08-21 ENCOUNTER — Other Ambulatory Visit: Payer: Self-pay | Admitting: Internal Medicine

## 2020-08-21 DIAGNOSIS — J302 Other seasonal allergic rhinitis: Secondary | ICD-10-CM

## 2020-10-03 ENCOUNTER — Encounter: Payer: Self-pay | Admitting: Internal Medicine

## 2020-10-03 ENCOUNTER — Ambulatory Visit (INDEPENDENT_AMBULATORY_CARE_PROVIDER_SITE_OTHER): Payer: BC Managed Care – PPO | Admitting: Internal Medicine

## 2020-10-03 ENCOUNTER — Other Ambulatory Visit: Payer: Self-pay

## 2020-10-03 VITALS — BP 168/100 | HR 78 | Temp 97.9°F | Ht 60.0 in | Wt 132.0 lb

## 2020-10-03 DIAGNOSIS — Z23 Encounter for immunization: Secondary | ICD-10-CM | POA: Diagnosis not present

## 2020-10-03 DIAGNOSIS — R200 Anesthesia of skin: Secondary | ICD-10-CM

## 2020-10-03 LAB — COMPREHENSIVE METABOLIC PANEL
ALT: 24 U/L (ref 0–35)
AST: 23 U/L (ref 0–37)
Albumin: 4.7 g/dL (ref 3.5–5.2)
Alkaline Phosphatase: 57 U/L (ref 39–117)
BUN: 18 mg/dL (ref 6–23)
CO2: 31 mEq/L (ref 19–32)
Calcium: 10.3 mg/dL (ref 8.4–10.5)
Chloride: 102 mEq/L (ref 96–112)
Creatinine, Ser: 0.84 mg/dL (ref 0.40–1.20)
GFR: 73.8 mL/min (ref >60.00)
Glucose, Bld: 87 mg/dL (ref 70–99)
Potassium: 4.7 mEq/L (ref 3.5–5.1)
Sodium: 137 mEq/L (ref 135–145)
Total Bilirubin: 0.6 mg/dL (ref 0.2–1.2)
Total Protein: 7.4 g/dL (ref 6.0–8.3)

## 2020-10-03 LAB — CBC
HCT: 42.3 % (ref 36.0–46.0)
Hemoglobin: 14.4 g/dL (ref 12.0–15.0)
MCHC: 34 g/dL (ref 30.0–36.0)
MCV: 91 fl (ref 78.0–100.0)
Platelets: 182 10*3/uL (ref 150.0–400.0)
RBC: 4.65 Mil/uL (ref 3.87–5.11)
RDW: 12.8 % (ref 11.5–15.5)
WBC: 6.6 10*3/uL (ref 4.0–10.5)

## 2020-10-03 LAB — VITAMIN D 25 HYDROXY (VIT D DEFICIENCY, FRACTURES): VITD: 37.21 ng/mL (ref 30.00–100.00)

## 2020-10-03 LAB — VITAMIN B12: Vitamin B-12: 346 pg/mL (ref 211–911)

## 2020-10-03 LAB — TSH: TSH: 2.37 u[IU]/mL (ref 0.35–4.50)

## 2020-10-03 LAB — HEMOGLOBIN A1C: Hgb A1c MFr Bld: 5.6 % (ref 4.6–6.5)

## 2020-10-03 NOTE — Progress Notes (Signed)
   Subjective:   Patient ID: Beth Aguilar, female    DOB: [birth year], 65 y.o.   MRN: [id number]  HPI The patient is a 65 YO female coming in for numbness in the hands for the past several months. Overall it is stable to mildly worsening. Has tried nothing. Denies weakness in the hands. Was more night time and now in the daytime as well.   Review of Systems  Constitutional: Negative.   HENT: Negative.   Eyes: Negative.   Respiratory: Negative for cough, chest tightness and shortness of breath.   Cardiovascular: Negative for chest pain, palpitations and leg swelling.  Gastrointestinal: Negative for abdominal distention, abdominal pain, constipation, diarrhea, nausea and vomiting.  Musculoskeletal: Negative.   Skin: Negative.   Neurological: Positive for numbness.  Psychiatric/Behavioral: Negative.     Objective:  Physical Exam Constitutional:      Appearance: She is well-developed and well-nourished.  HENT:     Head: Normocephalic and atraumatic.  Eyes:     Extraocular Movements: EOM normal.  Cardiovascular:     Rate and Rhythm: Normal rate and regular rhythm.  Pulmonary:     Effort: Pulmonary effort is normal. No respiratory distress.     Breath sounds: Normal breath sounds. No wheezing or rales.  Abdominal:     General: Bowel sounds are normal. There is no distension.     Palpations: Abdomen is soft.     Tenderness: There is no abdominal tenderness. There is no rebound.  Musculoskeletal:        General: No edema.     Cervical back: Normal range of motion.  Skin:    General: Skin is warm and dry.  Neurological:     Mental Status: She is alert and oriented to person, place, and time.     Coordination: Coordination normal.  Psychiatric:        Mood and Affect: Mood and affect normal.     Vitals:   10/03/20 0825  BP: (!) 168/100  Pulse: 78  Temp: 97.9 F (36.6 C)  TempSrc: Oral  SpO2: 99%  Weight: 132 lb (59.9 kg)  Height: 5' (1.524 m)    This visit occurred  during the SARS-CoV-2 public health emergency.  Safety protocols were in place, including screening questions prior to the visit, additional usage of staff PPE, and extensive cleaning of exam room while observing appropriate contact time as indicated for disinfecting solutions.   Assessment & Plan:  Shingrix IM given at visit

## 2020-10-03 NOTE — Patient Instructions (Signed)
We will check the blood work today and if normal will try a carpal tunnel brace to see if this helps.

## 2020-10-05 NOTE — Assessment & Plan Note (Signed)
Checking thyroid, vitamin levels, CBC, CMP, HGA1c and adjust as needed. Advised if normal to try carpal tunnel brace.

## 2020-10-30 ENCOUNTER — Encounter: Payer: Self-pay | Admitting: Internal Medicine

## 2020-11-05 ENCOUNTER — Encounter: Payer: Self-pay | Admitting: Internal Medicine

## 2020-11-05 ENCOUNTER — Ambulatory Visit: Payer: BC Managed Care – PPO | Admitting: Internal Medicine

## 2020-11-05 ENCOUNTER — Ambulatory Visit (INDEPENDENT_AMBULATORY_CARE_PROVIDER_SITE_OTHER): Payer: BC Managed Care – PPO

## 2020-11-05 ENCOUNTER — Other Ambulatory Visit: Payer: Self-pay

## 2020-11-05 VITALS — BP 118/78 | HR 72 | Temp 97.8°F | Resp 18 | Ht 60.0 in | Wt 131.8 lb

## 2020-11-05 DIAGNOSIS — R200 Anesthesia of skin: Secondary | ICD-10-CM

## 2020-11-05 NOTE — Progress Notes (Unsigned)
Subjective:   Patient ID: Beth Aguilar, female    DOB: 02/17/1956, 65 y.o.   MRN: [id number]  HPI The patient is a 65 YO female coming in for concerns about numbness in her hands which has progressed significantly since last visit. She is using a brace at night time on the right hand which did help (she has just started using this recently). Numbness is severe at night time with sleeping. During the day there is weakness in the thumb and pinky bilaterally making writing and other tasks challenging. 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 She is just a little confused why both hands would be bad at once and wants to get to the root and have treatment for this. Some additional numbness in the feet which she has had previously, tried different shoes recently but this did not help.   Review of Systems  Constitutional: Negative.   HENT: Negative.   Eyes: Negative.   Respiratory: Negative for cough, chest tightness and shortness of breath.   Cardiovascular: Negative for chest pain, palpitations and leg swelling.  Gastrointestinal: Negative for abdominal distention, abdominal pain, constipation, diarrhea, nausea and vomiting.  Musculoskeletal: Positive for myalgias. Negative for neck pain and neck stiffness.  Skin: Negative.   Neurological: Positive for numbness.  Psychiatric/Behavioral: Negative.     Objective:  Physical Exam Constitutional:      Appearance: She is well-developed and well-nourished.  HENT:     Head: Normocephalic and atraumatic.  Eyes:     Extraocular Movements: EOM normal.  Cardiovascular:     Rate and Rhythm: Normal rate and regular rhythm.  Pulmonary:     Effort: Pulmonary effort is normal. No respiratory distress.     Breath sounds: Normal breath sounds. No wheezing or rales.  Abdominal:      General: Bowel sounds are normal. There is no distension.     Palpations: Abdomen is soft.     Tenderness: There is no abdominal tenderness. There is no rebound.  Musculoskeletal:        General: No edema.     Cervical back: Normal range of motion.     Comments: Described locking left pinky, some change in sensation and some weakness with the opposition of the thumb, no tenderness over the spine  Skin:    General: Skin is warm and dry.  Neurological:     Mental Status: She is alert and oriented to person, place, and time.     Coordination: Coordination normal.  Psychiatric:        Mood and Affect: Mood and affect normal.     Vitals:   11/05/20 1450  BP: 118/78  Pulse: 72  Resp: 18  Temp: 97.8 F (36.6 C)  TempSrc: Oral  SpO2: 98%  Weight: 131 lb 12.8 oz (59.8 kg)  Height: 5' (1.524 m)    This visit occurred during the SARS-CoV-2 public health emergency.  Safety protocols were in place, including screening questions prior to the visit, additional usage of staff PPE, and extensive cleaning of exam room while observing appropriate contact time as indicated for disinfecting solutions.   Assessment & Plan:  Visit time 20 minutes in face to face communication with patient and coordination of care, additional 10 minutes spent in record review, coordination or care, ordering tests, communicating/referring to other healthcare  professionals, documenting in medical records all on the same day of the visit for total time 30 minutes spent on the visit.

## 2020-11-05 NOTE — Patient Instructions (Signed)
We will check the x-ray of the neck today and move forward from there.

## 2020-11-06 ENCOUNTER — Encounter: Payer: Self-pay | Admitting: Internal Medicine

## 2020-11-06 NOTE — Assessment & Plan Note (Signed)
Checking x-ray cervical spine to rule out impingement or significant change in arthritis in the past decade. If changes will pursue neurosurgery, if no changes will pursue hand specialist for likely carpal tunnel in the hands which is progressing.

## 2020-11-07 ENCOUNTER — Other Ambulatory Visit: Payer: Self-pay | Admitting: Internal Medicine

## 2020-11-07 DIAGNOSIS — R200 Anesthesia of skin: Secondary | ICD-10-CM

## 2020-11-07 DIAGNOSIS — M5412 Radiculopathy, cervical region: Secondary | ICD-10-CM

## 2020-11-14 ENCOUNTER — Other Ambulatory Visit: Payer: Self-pay | Admitting: Neurosurgery

## 2020-11-14 DIAGNOSIS — M4712 Other spondylosis with myelopathy, cervical region: Secondary | ICD-10-CM

## 2020-12-03 ENCOUNTER — Ambulatory Visit (INDEPENDENT_AMBULATORY_CARE_PROVIDER_SITE_OTHER): Payer: BC Managed Care – PPO | Admitting: *Deleted

## 2020-12-03 ENCOUNTER — Other Ambulatory Visit: Payer: Self-pay

## 2020-12-03 DIAGNOSIS — Z23 Encounter for immunization: Secondary | ICD-10-CM

## 2020-12-03 NOTE — Progress Notes (Signed)
Pls cosign for Shingrix 2 inj.Marland KitchenRaechel Chute

## 2020-12-14 ENCOUNTER — Ambulatory Visit
Admission: RE | Admit: 2020-12-14 | Discharge: 2020-12-14 | Disposition: A | Discharge status: Home / Self Care | Payer: BC Managed Care – PPO | Source: Ambulatory Visit | Attending: Neurosurgery | Admitting: Neurosurgery

## 2020-12-14 ENCOUNTER — Other Ambulatory Visit: Payer: Self-pay

## 2020-12-14 DIAGNOSIS — M4712 Other spondylosis with myelopathy, cervical region: Secondary | ICD-10-CM

## 2021-01-29 ENCOUNTER — Ambulatory Visit: Payer: BC Managed Care – PPO | Attending: Internal Medicine

## 2021-01-29 DIAGNOSIS — Z23 Encounter for immunization: Secondary | ICD-10-CM

## 2021-01-29 NOTE — Progress Notes (Signed)
   Covid-19 Vaccination Clinic  Name:  Beth Aguilar    MRN: [id number] DOB: December 26, 1955  01/29/2021  [name] was observed post Covid-19 immunization for 15 minutes without incident. She was provided with Vaccine Information Sheet and instruction to access the V-Safe system.   [name] was instructed to call 911 with any severe reactions post vaccine: Marland Kitchen Difficulty breathing  . Swelling of face and throat  . A fast heartbeat  . A bad rash all over body  . Dizziness and weakness   Immunizations Administered    Name Date Dose VIS Date Route   PFIZER Comrnaty(Gray TOP) Covid-19 Vaccine 01/29/2021 12:55 PM 0.3 mL 08/30/2020 Intramuscular   Manufacturer: [employer], [employer]   Lot: [id number]   NDC: [address]-0     Drusilla Kanner, PharmD, MBA Clinical Acute Care Pharmacist

## 2021-01-30 ENCOUNTER — Other Ambulatory Visit: Payer: Self-pay

## 2021-01-30 MED ORDER — PFIZER-BIONT COVID-19 VAC-TRIS 30 MCG/0.3ML IM SUSP
INTRAMUSCULAR | 0 refills | Status: DC
  Filled 2021-01-30: qty 0.3, 1d supply, fill #0

## 2021-03-26 ENCOUNTER — Ambulatory Visit: Payer: BC Managed Care – PPO | Admitting: Internal Medicine

## 2021-03-26 ENCOUNTER — Other Ambulatory Visit: Payer: Self-pay

## 2021-03-26 ENCOUNTER — Encounter: Payer: Self-pay | Admitting: Internal Medicine

## 2021-03-26 VITALS — BP 120/80 | HR 68 | Temp 97.9°F | Resp 18 | Ht 60.0 in | Wt 128.2 lb

## 2021-03-26 DIAGNOSIS — R21 Rash and other nonspecific skin eruption: Secondary | ICD-10-CM | POA: Diagnosis not present

## 2021-03-26 DIAGNOSIS — R042 Hemoptysis: Secondary | ICD-10-CM

## 2021-03-26 MED ORDER — METHYLPREDNISOLONE ACETATE 40 MG/ML IJ SUSP
40.0000 mg | Freq: Once | INTRAMUSCULAR | Status: AC
  Administered 2021-03-26: 16:00:00 40 mg via INTRAMUSCULAR

## 2021-03-26 NOTE — Patient Instructions (Addendum)
We have given you the steroid shot today for the poison ivy.

## 2021-03-26 NOTE — Progress Notes (Signed)
   Subjective:   Patient ID: Beth Aguilar, female    DOB: [birth year], 65 y.o.   MRN: [id number]  HPI The patient is a 65 YO female coming in for poison ivy. She was exposed this past weekend while working in the yard. She is very sensitive to it and although she was very careful did get exposed to this. Rash on face and neck and chest. She denies swelling in face or mouth or problems with breathing. Overall very itchy and not improving well. Has tried otc for this. Also having some maroon sputum in the last month or so which correlates to more drainage. Some hoarse voice with drainage. Intermittent and has not had the last 2 days but previous was at least a few times a week.   Review of Systems  Constitutional: Negative.   HENT: Negative.    Eyes: Negative.   Respiratory:  Negative for cough, chest tightness and shortness of breath.   Cardiovascular:  Negative for chest pain, palpitations and leg swelling.  Gastrointestinal:  Negative for abdominal distention, abdominal pain, constipation, diarrhea, nausea and vomiting.  Musculoskeletal: Negative.   Skin:  Positive for rash.  Neurological: Negative.   Psychiatric/Behavioral: Negative.     Objective:  Physical Exam Constitutional:      Appearance: She is well-developed.  HENT:     Head: Normocephalic and atraumatic.  Cardiovascular:     Rate and Rhythm: Normal rate and regular rhythm.  Pulmonary:     Effort: Pulmonary effort is normal. No respiratory distress.     Breath sounds: Normal breath sounds. No wheezing or rales.  Abdominal:     General: Bowel sounds are normal. There is no distension.     Palpations: Abdomen is soft.     Tenderness: There is no abdominal tenderness. There is no rebound.  Musculoskeletal:     Cervical back: Normal range of motion.  Skin:    General: Skin is warm and dry.     Findings: Rash present.     Comments: Rash consistent with poison ivy reaction  Neurological:     Mental Status: She is alert  and oriented to person, place, and time.     Coordination: Coordination normal.    Vitals:   03/26/21 1545  BP: 120/80  Pulse: 68  Resp: 18  Temp: 97.9 F (36.6 C)  TempSrc: Oral  SpO2: 98%  Weight: 128 lb 3.2 oz (58.2 kg)  Height: 5' (1.524 m)    This visit occurred during the SARS-CoV-2 public health emergency.  Safety protocols were in place, including screening questions prior to the visit, additional usage of staff PPE, and extensive cleaning of exam room while observing appropriate contact time as indicated for disinfecting solutions.   Assessment & Plan:  Depo-medrol 40 mg IM given at visit

## 2021-03-27 DIAGNOSIS — R042 Hemoptysis: Secondary | ICD-10-CM | POA: Insufficient documentation

## 2021-03-27 NOTE — Assessment & Plan Note (Signed)
Consistent with poison ivy and given depo-medrol 40 mg IM at visit.

## 2021-03-27 NOTE — Assessment & Plan Note (Signed)
She did not want further assessment of this today but will discuss when she comes back next.

## 2021-04-16 ENCOUNTER — Ambulatory Visit: Payer: BC Managed Care – PPO | Attending: Critical Care Medicine

## 2021-04-16 DIAGNOSIS — Z20822 Contact with and (suspected) exposure to covid-19: Secondary | ICD-10-CM

## 2021-04-17 ENCOUNTER — Telehealth: Payer: Self-pay | Admitting: Internal Medicine

## 2021-04-17 LAB — NOVEL CORONAVIRUS, NAA: SARS-CoV-2, NAA: DETECTED — AB

## 2021-04-17 LAB — SARS-COV-2, NAA 2 DAY TAT

## 2021-04-17 MED ORDER — NIRMATRELVIR/RITONAVIR (PAXLOVID)TABLET: 3.0000 | ORAL_TABLET | Freq: Two times a day (BID) | ORAL | 0 refills | Status: AC

## 2021-04-17 NOTE — Telephone Encounter (Signed)
Please advise since crawford is gone for the day.

## 2021-04-17 NOTE — Telephone Encounter (Signed)
Spoke with the patient and she stated that her symptoms started on Sunday. She stated that she is having a low grade fever, heavy cough and issues with lucidity. Please advise

## 2021-04-17 NOTE — Telephone Encounter (Signed)
Patient tested positive w/ at home COVID test 04/16/2021 also went to Urgent Care & is awaiting results  Heavy cough, low grade fever, dizziness, congestion, diarrhea  Would like to know if patient is eligible to get med [facility] sent to pharmacy:  [facility] - [place], [place] - [address] Phone:  [phone]  Fax:  [phone]

## 2021-04-17 NOTE — Telephone Encounter (Signed)
Patient requesting a call to discuss covid results  Please call

## 2021-04-17 NOTE — Telephone Encounter (Signed)
Ok I sent the paxlovid

## 2021-05-01 ENCOUNTER — Telehealth: Payer: Self-pay | Admitting: Internal Medicine

## 2021-05-01 NOTE — Telephone Encounter (Signed)
See below

## 2021-05-01 NOTE — Telephone Encounter (Signed)
Patient called in to schedule yearly physical (08.23.22)  Wants to know if she needs to do bloodwork prior to coming to appt  Please fu [phone]

## 2021-05-02 NOTE — Telephone Encounter (Signed)
Called patient. LVM asking her to return my call here at the office. Office number was provided.  

## 2021-05-02 NOTE — Telephone Encounter (Signed)
If she wants we can order in advance otherwise okay to do day of visit. Let me know if desired prior to visit.

## 2021-05-14 ENCOUNTER — Other Ambulatory Visit: Payer: Self-pay

## 2021-05-14 ENCOUNTER — Encounter: Payer: Self-pay | Admitting: Internal Medicine

## 2021-05-14 ENCOUNTER — Ambulatory Visit (INDEPENDENT_AMBULATORY_CARE_PROVIDER_SITE_OTHER): Payer: BC Managed Care – PPO | Admitting: Internal Medicine

## 2021-05-14 ENCOUNTER — Ambulatory Visit (INDEPENDENT_AMBULATORY_CARE_PROVIDER_SITE_OTHER): Payer: BC Managed Care – PPO

## 2021-05-14 VITALS — BP 120/80 | HR 77 | Temp 97.7°F | Resp 18 | Ht 60.0 in | Wt 127.2 lb

## 2021-05-14 DIAGNOSIS — E782 Mixed hyperlipidemia: Secondary | ICD-10-CM | POA: Diagnosis not present

## 2021-05-14 DIAGNOSIS — Z0000 Encounter for general adult medical examination without abnormal findings: Secondary | ICD-10-CM | POA: Diagnosis not present

## 2021-05-14 DIAGNOSIS — R413 Other amnesia: Secondary | ICD-10-CM | POA: Insufficient documentation

## 2021-05-14 DIAGNOSIS — R042 Hemoptysis: Secondary | ICD-10-CM | POA: Diagnosis not present

## 2021-05-14 LAB — LIPID PANEL
Cholesterol: 181 mg/dL (ref 0–200)
HDL: 49.4 mg/dL (ref >39.00)
LDL Cholesterol: 98 mg/dL (ref 0–99)
NonHDL: 131.51
Total CHOL/HDL Ratio: 4
Triglycerides: 170 mg/dL — ABNORMAL HIGH (ref 0.0–149.0)
VLDL: 34 mg/dL (ref 0.0–40.0)

## 2021-05-14 NOTE — Assessment & Plan Note (Signed)
Flu shot yearly. Covid-19 up to date. Shingrix complete. Tetanus declines today. Colonoscopy up to date. Mammogram up to date, pap smear up to date and dexa up to date. Counseled about sun safety and mole surveillance. Counseled about the dangers of distracted driving. Given 10 year screening recommendations.

## 2021-05-14 NOTE — Assessment & Plan Note (Signed)
Ordered CXR and ANA, RF panel to assess. We did discuss that CXR can miss some causes of hemoptysis and if this is normal we would need to pursue CT chest with contrast to fully evaluate.

## 2021-05-14 NOTE — Progress Notes (Signed)
   Subjective:   Patient ID: Beth Aguilar, female    DOB: [birth year], 65 y.o.   MRN: [id number]  HPI The patient is a 65 YO female coming in for physical with some other concerns.  HPI #2: Here for ongoing hemoptysis (did not want asessed beginning of July due to new problem and she thought was related to allergies, does cough up rust colored sputum most mornings, no coughing during day, no weight loss or fevers or chills or SOB or night sweats), and memory change (feels about 4 yeas ago memory changed, since retirement last year struggling with remembering words at times and people's names, no problems with ADLs or driving but she is concerned).   PMH, [facility], social history reviewed and updated  Review of Systems  Constitutional: Negative.   HENT: Negative.    Eyes: Negative.   Respiratory:  Negative for cough, chest tightness and shortness of breath.        Hemoptysis  Cardiovascular:  Negative for chest pain, palpitations and leg swelling.  Gastrointestinal:  Negative for abdominal distention, abdominal pain, constipation, diarrhea, nausea and vomiting.  Musculoskeletal: Negative.   Skin: Negative.   Neurological: Negative.        Memory change  Psychiatric/Behavioral: Negative.     Objective:  Physical Exam Constitutional:      Appearance: She is well-developed.  HENT:     Head: Normocephalic and atraumatic.  Cardiovascular:     Rate and Rhythm: Normal rate and regular rhythm.  Pulmonary:     Effort: Pulmonary effort is normal. No respiratory distress.     Breath sounds: Normal breath sounds. No wheezing or rales.  Abdominal:     General: Bowel sounds are normal. There is no distension.     Palpations: Abdomen is soft.     Tenderness: There is no abdominal tenderness. There is no rebound.  Musculoskeletal:     Cervical back: Normal range of motion.  Skin:    General: Skin is warm and dry.  Neurological:     Mental Status: She is alert and oriented to person, place, and  time.     Coordination: Coordination normal.    Vitals:   05/14/21 0813  BP: 120/80  Pulse: 77  Resp: 18  Temp: 97.7 F (36.5 C)  TempSrc: Oral  SpO2: 97%  Weight: 127 lb 3.2 oz (57.7 kg)  Height: 5' (1.524 m)    This visit occurred during the SARS-CoV-2 public health emergency.  Safety protocols were in place, including screening questions prior to the visit, additional usage of staff PPE, and extensive cleaning of exam room while observing appropriate contact time as indicated for disinfecting solutions.   Assessment & Plan:

## 2021-05-14 NOTE — Patient Instructions (Addendum)
We will check the chest x-ray today and get the MRI of the brain scheduled and do the blood work today.

## 2021-05-14 NOTE — Assessment & Plan Note (Signed)
Checking MRI brain as none recently (she states one in 2004 in [place]). Recent labs to rule out alternate cause from Jan 2022 do not need to be repeated.

## 2021-05-14 NOTE — Assessment & Plan Note (Signed)
Checking lipid panel and adjust as needed. Not on medication currently.  

## 2021-05-15 ENCOUNTER — Other Ambulatory Visit: Payer: Self-pay | Admitting: Internal Medicine

## 2021-05-15 DIAGNOSIS — R042 Hemoptysis: Secondary | ICD-10-CM

## 2021-05-16 LAB — ANA, IFA COMPREHENSIVE PANEL
Anti Nuclear Antibody (ANA): NEGATIVE
ENA SM Ab Ser-aCnc: <1 AI
SM/RNP: <1 AI
SSA (Ro) (ENA) Antibody, IgG: <1 AI
SSB (La) (ENA) Antibody, IgG: <1 AI
Scleroderma (Scl-70) (ENA) Antibody, IgG: <1 AI
ds DNA Ab: <1 IU/mL

## 2021-05-16 LAB — RHEUMATOID FACTOR: Rheumatoid fact SerPl-aCnc: <14 IU/mL (ref <14)

## 2021-06-13 ENCOUNTER — Other Ambulatory Visit: Payer: Self-pay | Admitting: Internal Medicine

## 2021-07-26 ENCOUNTER — Other Ambulatory Visit: Payer: Self-pay | Admitting: Internal Medicine

## 2021-07-26 DIAGNOSIS — J302 Other seasonal allergic rhinitis: Secondary | ICD-10-CM

## 2021-08-12 ENCOUNTER — Telehealth: Payer: Self-pay | Admitting: Internal Medicine

## 2021-08-12 NOTE — Telephone Encounter (Signed)
Order is still good referrals can schedule.

## 2021-08-12 NOTE — Telephone Encounter (Signed)
Patient is ready to get the MRI done. Requesting a new order to be put in.   Please advise.

## 2021-08-12 NOTE — Telephone Encounter (Signed)
See below

## 2021-08-28 ENCOUNTER — Telehealth: Payer: Self-pay

## 2021-08-28 NOTE — Telephone Encounter (Signed)
Patient wondering if medication could me called in for Anxiety prior to having Mri done on Friday?

## 2021-08-29 MED ORDER — ALPRAZOLAM 0.5 MG PO TABS: 0.5000 mg | ORAL_TABLET | Freq: Once | ORAL | 0 refills | Status: AC

## 2021-08-29 NOTE — Telephone Encounter (Signed)
Sent in xanax to take 30 minutes prior to MRI. Needs driver to take her home in case of reaction to medication.

## 2021-08-30 ENCOUNTER — Ambulatory Visit
Admission: RE | Admit: 2021-08-30 | Discharge: 2021-08-30 | Disposition: A | Discharge status: Home / Self Care | Payer: BC Managed Care – PPO | Source: Ambulatory Visit | Attending: Internal Medicine | Admitting: Internal Medicine

## 2021-08-30 ENCOUNTER — Other Ambulatory Visit: Payer: Self-pay

## 2021-08-30 DIAGNOSIS — R413 Other amnesia: Secondary | ICD-10-CM

## 2021-09-02 ENCOUNTER — Encounter: Payer: Self-pay | Admitting: Internal Medicine

## 2021-09-02 DIAGNOSIS — R413 Other amnesia: Secondary | ICD-10-CM

## 2021-11-14 ENCOUNTER — Other Ambulatory Visit: Payer: Self-pay

## 2021-11-14 ENCOUNTER — Ambulatory Visit: Payer: BC Managed Care – PPO | Admitting: Nurse Practitioner

## 2021-11-14 VITALS — BP 124/80 | HR 75 | Temp 97.9°F | Ht 60.0 in | Wt 132.4 lb

## 2021-11-14 DIAGNOSIS — H66001 Acute suppurative otitis media without spontaneous rupture of ear drum, right ear: Secondary | ICD-10-CM | POA: Diagnosis not present

## 2021-11-14 MED ORDER — AMOXICILLIN-POT CLAVULANATE 875-125 MG PO TABS: 1.0000 | ORAL_TABLET | Freq: Two times a day (BID) | ORAL | 0 refills | Status: DC

## 2021-11-14 NOTE — Progress Notes (Signed)
Subjective:  Patient ID: Stephany Poorman, female    DOB: [birth year]  Age: 66 y.o. MRN: [id number]  CC:  Chief Complaint  Patient presents with   Ear Pain    Accompanied by ear pressure on and off for a week.      HPI  This patient arrives today for the above.  She reports that she has been having ear pain and ear fullness for about 1 month.  Over the last week it seems to have gotten worse.  She has been taking children's Claritin syrup without improvement in her symptoms.  She also tried guaifenesin which made her feel drunk.  She does report some intermittent hearing loss but this seems to clear when she swallows her throat.  Past Medical History:  Diagnosis Date   Kidney stone       Family History  Problem Relation Age of Onset   Depression Mother    Arthritis Mother    Heart disease Mother    Hypertension Mother    Hyperlipidemia Mother    Stroke Father     Social History   Social History Narrative   Not on file   Social History   Tobacco Use   Smoking status: Never   Smokeless tobacco: Never  Substance Use Topics   Alcohol use: No     Current Meds  Medication Sig   amoxicillin-clavulanate (AUGMENTIN) 875-125 MG tablet Take 1 tablet by mouth 2 (two) times daily.   aspirin 81 MG tablet Take 1 tablet (81 mg total) by mouth daily.   azelastine (ASTELIN) 0.1 % nasal spray PLACE 2 SPRAYS INTO EACH NOSTRIL 2 TIMES DAILY AS DIRECTED.   rosuvastatin (CRESTOR) 10 MG tablet TAKE 1 TABLET BY MOUTH DAILY.    ROS:  Review of Systems  Constitutional:  Negative for fever.  HENT:  Positive for ear pain and hearing loss. Negative for congestion and sinus pain.   Respiratory:  Negative for cough and shortness of breath.     Objective:   Today's Vitals: BP 124/80 (BP Location: Left Arm, Patient Position: Sitting, Cuff Size: Normal)    Pulse 75    Temp 97.9 F (36.6 C) (Oral)    Ht 5' (1.524 m)    Wt 132 lb 6.4 oz (60.1 kg)    SpO2 97%    BMI 25.86 kg/m   Vitals with BMI 11/14/2021 05/14/2021 03/26/2021  Height 5\' 0"  5\' 0"  5\' 0"   Weight 132 lbs 6 oz 127 lbs 3 oz 128 lbs 3 oz  BMI 25.86 24.84 25.04  Systolic 124 120  Diastolic 80 80 80  Pulse 75 77 68     Physical Exam Vitals reviewed.  Constitutional:      General: She is not in acute distress.    Appearance: Normal appearance.  HENT:     Head: Normocephalic and atraumatic.     Right Ear: Hearing, ear canal and external ear normal. Tympanic membrane is erythematous.     Left Ear: Hearing, tympanic membrane and external ear normal.     Ears:     Comments: Mild redness to left ear canal Neck:     Vascular: No carotid bruit.  Cardiovascular:     Rate and Rhythm: Normal rate and regular rhythm.     Pulses: Normal pulses.     Heart sounds: Normal heart sounds.  Pulmonary:     Effort: Pulmonary effort is normal.     Breath sounds: Normal breath sounds.  Skin:  General: Skin is warm and dry.  Neurological:     General: No focal deficit present.     Mental Status: She is alert and oriented to person, place, and time.  Psychiatric:        Mood and Affect: Mood normal.        Behavior: Behavior normal.        Judgment: Judgment normal.         Assessment and Plan   1. Non-recurrent acute suppurative otitis media of right ear without spontaneous rupture of tympanic membrane      Plan: 1.  We will treat with course of Augmentin.  She was encouraged to let me know if symptoms persist or do not improve despite antibiotic.  She reports her understanding.  We did discuss common side effects of Augmentin, she had no further questions.  She is encouraged to continue taking her over-the-counter Claritin as well for symptomatic management.   Tests ordered No orders of the defined types were placed in this encounter.     Meds ordered this encounter  Medications   amoxicillin-clavulanate (AUGMENTIN) 875-125 MG tablet    Sig: Take 1 tablet by mouth 2 (two) times daily.     Dispense:  14 tablet    Refill:  0    Order Specific Question:   Supervising Provider    Answer:   Pincus Sanes [id number]    Patient to follow-up if symptoms persist or do not improve.  Elenore Paddy, NP

## 2021-11-21 ENCOUNTER — Ambulatory Visit: Payer: BC Managed Care – PPO | Admitting: Nurse Practitioner

## 2021-11-25 ENCOUNTER — Other Ambulatory Visit: Payer: Self-pay

## 2021-11-25 ENCOUNTER — Ambulatory Visit: Payer: BC Managed Care – PPO | Admitting: Internal Medicine

## 2021-11-25 ENCOUNTER — Encounter: Payer: Self-pay | Admitting: Internal Medicine

## 2021-11-25 VITALS — BP 122/80 | HR 66 | Temp 97.7°F | Resp 18 | Ht 60.0 in | Wt 131.2 lb

## 2021-11-25 DIAGNOSIS — J302 Other seasonal allergic rhinitis: Secondary | ICD-10-CM

## 2021-11-25 DIAGNOSIS — R413 Other amnesia: Secondary | ICD-10-CM

## 2021-11-25 NOTE — Progress Notes (Signed)
? ?  Subjective:  ? ?Patient ID: Beth Aguilar, female    DOB: [birth year], 66 y.o.   MRN: [id number] ? ?HPI ?The patient is a 66 YO female coming in for ear problems and memory follow up. ? ?Review of Systems  ?Constitutional: Negative.   ?HENT:  Positive for congestion.   ?     Ear pressure/popping  ?Eyes: Negative.   ?Respiratory:  Negative for cough, chest tightness and shortness of breath.   ?Cardiovascular:  Negative for chest pain, palpitations and leg swelling.  ?Gastrointestinal:  Negative for abdominal distention, abdominal pain, constipation, diarrhea, nausea and vomiting.  ?Musculoskeletal: Negative.   ?Skin: Negative.   ?Neurological: Negative.   ?Psychiatric/Behavioral: Negative.    ? ?Objective:  ?Physical Exam ?Constitutional:   ?   Appearance: She is well-developed.  ?HENT:  ?   Head: Normocephalic and atraumatic.  ?   Right Ear: Tympanic membrane normal.  ?   Left Ear: Tympanic membrane normal.  ?Cardiovascular:  ?   Rate and Rhythm: Normal rate and regular rhythm.  ?Pulmonary:  ?   Effort: Pulmonary effort is normal. No respiratory distress.  ?   Breath sounds: Normal breath sounds. No wheezing or rales.  ?Abdominal:  ?   General: Bowel sounds are normal. There is no distension.  ?   Palpations: Abdomen is soft.  ?   Tenderness: There is no abdominal tenderness. There is no rebound.  ?Musculoskeletal:  ?   Cervical back: Normal range of motion.  ?Skin: ?   General: Skin is warm and dry.  ?Neurological:  ?   Mental Status: She is alert and oriented to person, place, and time.  ?   Coordination: Coordination normal.  ? ? ?Vitals:  ? 11/25/21 0841  ?BP: 122/80  ?Pulse: 66  ?Resp: 18  ?Temp: 97.7 ?F (36.5 ?C)  ?TempSrc: Oral  ?SpO2: 99%  ?Weight: 131 lb 3.2 oz (59.5 kg)  ?Height: 5' (1.524 m)  ? ? ?This visit occurred during the SARS-CoV-2 public health emergency.  Safety protocols were in place, including screening questions prior to the visit, additional usage of staff PPE, and extensive cleaning of  exam room while observing appropriate contact time as indicated for disinfecting solutions.  ? ?Assessment & Plan:  ?Visit time 15 minutes in face to face communication with patient and coordination of care, additional 10 minutes spent in record review, coordination or care, ordering tests, communicating/referring to other healthcare professionals, documenting in medical records all on the same day of the visit for total time 25 minutes spent on the visit.  ? ?

## 2021-11-25 NOTE — Patient Instructions (Signed)
The ears look better. We will get you in with the memory testing for some objective data on how things are doing there.  ? ? ?

## 2021-11-26 ENCOUNTER — Encounter: Payer: Self-pay | Admitting: Internal Medicine

## 2021-11-26 NOTE — Assessment & Plan Note (Signed)
Ears look normal after antibiotic course. No further antibiotics needed. She is taking claritin liquid some days and advised she could do daily to see if this helps. Suspect pollen now is triggering symptoms. ?

## 2021-11-26 NOTE — Assessment & Plan Note (Signed)
She did not schedule visit with neuropsych due to long wait however now we will need new referral. Referral to [facility] to see if wait time is less. MRI normal and labs normal.  ?

## 2021-12-20 ENCOUNTER — Encounter: Payer: Self-pay | Admitting: Internal Medicine

## 2021-12-20 ENCOUNTER — Ambulatory Visit (INDEPENDENT_AMBULATORY_CARE_PROVIDER_SITE_OTHER): Payer: BC Managed Care – PPO

## 2021-12-20 ENCOUNTER — Ambulatory Visit: Payer: BC Managed Care – PPO | Admitting: Internal Medicine

## 2021-12-20 VITALS — BP 128/88 | HR 82 | Resp 18 | Ht 60.0 in | Wt 130.0 lb

## 2021-12-20 DIAGNOSIS — M5441 Lumbago with sciatica, right side: Secondary | ICD-10-CM | POA: Diagnosis not present

## 2021-12-20 DIAGNOSIS — M25561 Pain in right knee: Secondary | ICD-10-CM | POA: Diagnosis not present

## 2021-12-20 MED ORDER — KETOROLAC TROMETHAMINE 30 MG/ML IJ SOLN
30.0000 mg | Freq: Once | INTRAMUSCULAR | Status: AC
  Administered 2021-12-20: 30 mg via INTRAMUSCULAR

## 2021-12-20 MED ORDER — METHYLPREDNISOLONE ACETATE 40 MG/ML IJ SUSP
40.0000 mg | Freq: Once | INTRAMUSCULAR | Status: AC
  Administered 2021-12-20: 40 mg via INTRAMUSCULAR

## 2021-12-20 NOTE — Patient Instructions (Signed)
We are checking x-rays today and have done the steroid and toradol shot today. ?

## 2021-12-20 NOTE — Assessment & Plan Note (Signed)
Knee pain and low back pain could be both coming from low back. Checking x-ray lumbar and right hip. Given depo-medrol 40 mg IM and toradol 30 mg IM today.  ?

## 2021-12-20 NOTE — Assessment & Plan Note (Signed)
Given depo-medrol 40 mg IM and toradol 30 mg IM today at visit for pain. Checking x-ray knee to assess for arthritis. Clinically sounds like pre-patellar bursitis however is not tender anatomically on exam.  ?

## 2021-12-20 NOTE — Progress Notes (Signed)
? ?  Subjective:  ? ?Patient ID: Beth Aguilar, female    DOB: 1956-08-16, 66 y.o.   MRN: [id number] ? ?HPI ?The patient is a 66 YO female 2 weeks progressive knee and low back pain. Moving and packing more. Kneeling at church at onset but that did not hurt required assistance to get up.  ? ?Review of Systems  ?Constitutional: Negative.   ?HENT: Negative.    ?Eyes: Negative.   ?Respiratory:  Negative for cough, chest tightness and shortness of breath.   ?Cardiovascular:  Negative for chest pain, palpitations and leg swelling.  ?Gastrointestinal:  Negative for abdominal distention, abdominal pain, constipation, diarrhea, nausea and vomiting.  ?Musculoskeletal:  Positive for arthralgias.  ?Skin: Negative.   ?Neurological: Negative.   ?Psychiatric/Behavioral: Negative.    ? ?Objective:  ?Physical Exam ?Constitutional:   ?   Appearance: She is well-developed.  ?HENT:  ?   Head: Normocephalic and atraumatic.  ?Cardiovascular:  ?   Rate and Rhythm: Normal rate and regular rhythm.  ?Pulmonary:  ?   Effort: Pulmonary effort is normal. No respiratory distress.  ?   Breath sounds: Normal breath sounds. No wheezing or rales.  ?Abdominal:  ?   General: Bowel sounds are normal. There is no distension.  ?   Palpations: Abdomen is soft.  ?   Tenderness: There is no abdominal tenderness. There is no rebound.  ?Musculoskeletal:  ?   Cervical back: Normal range of motion.  ?   Comments: Standing during visit due to discomfort with standing, right knee without effusion or pain on palpation. Right ACL/PCL intact and LCL and MCL without pain to palpation.   ?Skin: ?   General: Skin is warm and dry.  ?Neurological:  ?   Mental Status: She is alert and oriented to person, place, and time.  ?   Coordination: Coordination normal.  ? ? ?Vitals:  ? 12/20/21 1525  ?BP: 128/88  ?Pulse: 82  ?Resp: 18  ?SpO2: 98%  ?Weight: 130 lb (59 kg)  ?Height: 5' (1.524 m)  ? ? ?This visit occurred during the SARS-CoV-2 public health emergency.  Safety protocols  were in place, including screening questions prior to the visit, additional usage of staff PPE, and extensive cleaning of exam room while observing appropriate contact time as indicated for disinfecting solutions.  ? ?Assessment & Plan:  ?Toradol 30 mg IM and depo-medrol 40 mg IM given at visit  ?

## 2021-12-24 ENCOUNTER — Encounter: Payer: Self-pay | Admitting: Internal Medicine

## 2022-01-17 ENCOUNTER — Ambulatory Visit (INDEPENDENT_AMBULATORY_CARE_PROVIDER_SITE_OTHER): Payer: Medicare PPO | Admitting: Family Medicine

## 2022-01-17 ENCOUNTER — Encounter: Payer: Self-pay | Admitting: Family Medicine

## 2022-01-17 VITALS — BP 132/80 | HR 75 | Temp 97.5°F | Ht 60.0 in | Wt 129.0 lb

## 2022-01-17 DIAGNOSIS — L237 Allergic contact dermatitis due to plants, except food: Secondary | ICD-10-CM | POA: Diagnosis not present

## 2022-01-17 DIAGNOSIS — R21 Rash and other nonspecific skin eruption: Secondary | ICD-10-CM | POA: Diagnosis not present

## 2022-01-17 MED ORDER — TRIAMCINOLONE ACETONIDE 0.1 % EX CREA: 1.0000 "application " | TOPICAL_CREAM | Freq: Two times a day (BID) | CUTANEOUS | 0 refills | Status: DC

## 2022-01-17 NOTE — Patient Instructions (Addendum)
[place] packets for any vesicles, oozing.  ? ?Use the topical steroid cream as discussed. ? ?Let me know how you do over the weekend ? ? ?

## 2022-01-17 NOTE — Progress Notes (Signed)
? ?Subjective:  ? ? ? Patient ID: Beth Aguilar, female    DOB: November 15, 1955, 66 y.o.   MRN: [id number] ? ?Chief Complaint  ?Patient presents with  ? Poison Ivy  ?  Left arm rash first appeared yesterday  ? ? ?HPI ?Patient is in today for recent poison ivy exposure. She has a pruritic rash on her left forearm. States she typically get a steroid injection for poison ivy rash and would like one today.  ? ?States she received a steroid injection IM 4 weeks ago for hip pain and also took a 2 week course of oral steroids in the past month.  ? ?No fever, chills, N/V. Feels at baseline.  ? ? ? ? ? ?Health Maintenance Due  ?Topic Date Due  ? Pneumonia Vaccine 66+ Years old (1 - PCV) Never done  ? HIV Screening  Never done  ? TETANUS/TDAP  Never done  ? PAP SMEAR-Modifier  04/02/2021  ? ? ?Past Medical History:  ?Diagnosis Date  ? Kidney stone   ? ? ?Past Surgical History:  ?Procedure Laterality Date  ? CESAREAN SECTION    ? x2  ? JOINT REPLACEMENT    ? TONSILLECTOMY    ? ? ?Family History  ?Problem Relation Age of Onset  ? Depression Mother   ? Arthritis Mother   ? Heart disease Mother   ? Hypertension Mother   ? Hyperlipidemia Mother   ? Stroke Father   ? ? ?Social History  ? ?Socioeconomic History  ? Marital status: Married  ?  Spouse name: Not on file  ? Number of children: Not on file  ? Years of education: Not on file  ? Highest education level: Not on file  ?Occupational History  ? Not on file  ?Tobacco Use  ? Smoking status: Never  ? Smokeless tobacco: Never  ?Substance and Sexual Activity  ? Alcohol use: No  ? Drug use: No  ? Sexual activity: Not on file  ?Other Topics Concern  ? Not on file  ?Social History Narrative  ? Not on file  ? ?Social Determinants of Health  ? ?Financial Resource Strain: Not on file  ?Food Insecurity: Not on file  ?Transportation Needs: Not on file  ?Physical Activity: Not on file  ?Stress: Not on file  ?Social Connections: Not on file  ?Intimate Partner Violence: Not on file  ? ? ?Outpatient  Medications Prior to Visit  ?Medication Sig Dispense Refill  ? aspirin 81 MG tablet Take 1 tablet (81 mg total) by mouth daily. 100 tablet 99  ? azelastine (ASTELIN) 0.1 % nasal spray PLACE 2 SPRAYS INTO EACH NOSTRIL 2 TIMES DAILY AS DIRECTED. 30 mL 2  ? rosuvastatin (CRESTOR) 10 MG tablet TAKE 1 TABLET BY MOUTH DAILY. 90 tablet 3  ? ?No facility-administered medications prior to visit.  ? ? ?Allergies  ?Allergen Reactions  ? Flonase [Fluticasone Propionate] Other (See Comments)  ?  hearing  ? Compazine [Prochlorperazine]   ? Gadolinium Derivatives   ? Guaifenesin & Derivatives   ?  Felt drunk  ? Sulfa Antibiotics   ? ? ?ROS ?Pertinent positives and negatives in the history of present illness. ? ?   ?Objective:  ?  ?Physical Exam ? ?BP 132/80 (BP Location: Right Arm, Patient Position: Sitting, Cuff Size: Large)   Pulse 75   Temp (!) 97.5 ?F (36.4 ?C) (Temporal)   Ht 5' (1.524 m)   Wt 129 lb (58.5 kg)   SpO2 98%   BMI 25.19 kg/m?  ?  Wt Readings from Last 3 Encounters:  ?01/17/22 129 lb (58.5 kg)  ?12/20/21 130 lb (59 kg)  ?11/25/21 131 lb 3.2 oz (59.5 kg)  ? ?Left anterior forearm with a patch of red, raised pruritic rash ?   ?Assessment & Plan:  ? ?Problem List Items Addressed This Visit   ?None ?Visit Diagnoses   ? ? Contact dermatitis due to poison ivy    -  Primary  ? Relevant Medications  ? triamcinolone cream (KENALOG) 0.1 %  ? Rash      ? ?  ? ?Counseling on the potential long term health consequences of frequent oral and IM steroids.  ?She has a focal rash and we will treat it with topical steroids.  ?Follow up if worsening.  ? ?I am having Beth Aguilar start on triamcinolone cream. I am also having her maintain her aspirin, rosuvastatin, and azelastine. ? ?Meds ordered this encounter  ?Medications  ? triamcinolone cream (KENALOG) 0.1 %  ?  Sig: Apply 1 application. topically 2 (two) times daily.  ?  Dispense:  30 g  ?  Refill:  0  ?  Order Specific Question:   Supervising Provider  ?  Answer:    Hillard Danker A [[id number]]  ? ? ?

## 2022-01-20 ENCOUNTER — Encounter: Payer: Self-pay | Admitting: Family Medicine

## 2022-01-28 ENCOUNTER — Telehealth: Payer: Self-pay | Admitting: Internal Medicine

## 2022-04-10 ENCOUNTER — Encounter: Payer: Self-pay | Admitting: Emergency Medicine

## 2022-04-10 ENCOUNTER — Ambulatory Visit: Payer: Medicare PPO | Admitting: Emergency Medicine

## 2022-04-10 VITALS — BP 128/74 | HR 71 | Temp 97.5°F | Ht 60.0 in | Wt 126.1 lb

## 2022-04-10 DIAGNOSIS — L509 Urticaria, unspecified: Secondary | ICD-10-CM | POA: Diagnosis not present

## 2022-04-10 DIAGNOSIS — S20211A Contusion of right front wall of thorax, initial encounter: Secondary | ICD-10-CM | POA: Diagnosis not present

## 2022-04-10 NOTE — Patient Instructions (Signed)
   Chest Wall Pain Chest wall pain is pain in or around the bones and muscles of your chest. Chest wall pain may be caused by: An injury. Coughing a lot. Using your chest and arm muscles too much. Sometimes, the cause may not be known. This pain may take a few weeks or longer to get better. Follow these instructions at home: Managing pain, stiffness, and swelling If told, put ice on the painful area: Put ice in a plastic bag. Place a towel between your skin and the bag. Leave the ice on for 20 minutes, 2-3 times a day.  Activity Rest as told by your doctor. Avoid doing things that cause pain. This includes lifting heavy items. Ask your doctor what activities are safe for you. General instructions  Take over-the-counter and prescription medicines only as told by your doctor. Do not use any products that contain nicotine or tobacco, such as cigarettes, e-cigarettes, and chewing tobacco. If you need help quitting, ask your doctor. Keep all follow-up visits as told by your doctor. This is important. Contact a doctor if: You have a fever. Your chest pain gets worse. You have new symptoms. Get help right away if: You feel sick to your stomach (nauseous) or you throw up (vomit). You feel sweaty or light-headed. You have a cough with mucus from your lungs (sputum) or you cough up blood. You are short of breath. These symptoms may be an emergency. Do not wait to see if the symptoms will go away. Get medical help right away. Call your local emergency services (911 in the [place] Do not drive yourself to the hospital. Summary Chest wall pain is pain in or around the bones and muscles of your chest. It may be treated with ice, rest, and medicines. Your condition may also get better if you avoid doing things that cause pain. Contact a doctor if you have a fever, chest pain that gets worse, or new symptoms. Get help right away if you feel light-headed or you get short of breath. These symptoms  may be an emergency. This information is not intended to replace advice given to you by your health care provider. Make sure you discuss any questions you have with your health care provider. Document Revised: 12/11/2020 Document Reviewed: 11/23/2020 Elsevier Patient Education  2023 [employer]  

## 2022-04-10 NOTE — Assessment & Plan Note (Signed)
Mild case.  No clinical concerns.  No red flag signs or symptoms. Small area in the face.  Recommend against use of facial corticosteroids. Requesting injection of corticosteroid but not available in the office today. Does not want to take oral steroids due to insomnia concerns. Advised to continue antihistamines as needed.

## 2022-04-10 NOTE — Progress Notes (Signed)
Beth Aguilar 66 y.o.   Chief Complaint  Patient presents with   Rash    Poison ivy on face    Fall    Pt had a fall on Tuesday. Pain under right breast     HISTORY OF PRESENT ILLNESS: This is a 66 y.o. female with a history of recurrent episodes of poison ivy.  Mostly arms and today a little bit on the face. Also fell last Tuesday and injured area on the right breast.  Denies syncopal episode. No other complaints or medical concerns today.  Rash Pertinent negatives include no congestion, cough, diarrhea, fever, shortness of breath, sore throat or vomiting.  Fall Pertinent negatives include no abdominal pain, fever, headaches, nausea or vomiting.     Prior to Admission medications   Medication Sig Start Date End Date Taking? Authorizing Provider  aspirin 81 MG tablet Take 1 tablet (81 mg total) by mouth daily. 03/11/17  Yes Corwin Levins, MD  azelastine (ASTELIN) 0.1 % nasal spray PLACE 2 SPRAYS INTO EACH NOSTRIL 2 TIMES DAILY AS DIRECTED. 07/26/21  Yes Myrlene Broker, MD  rosuvastatin (CRESTOR) 10 MG tablet TAKE 1 TABLET BY MOUTH DAILY. 06/13/21  Yes Myrlene Broker, MD  triamcinolone cream (KENALOG) 0.1 % Apply 1 application. topically 2 (two) times daily. 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   Polyarthralgia 07/25/2019   Acute pain of right knee 06/25/2018   Osteopenia 06/10/2017   Other seasonal allergic rhinitis 05/08/2015   Hyperlipidemia 11/04/2014   Routine general medical examination at a health care  facility 11/04/2014    Past Medical History:  Diagnosis Date   Kidney stone     Past Surgical History:  Procedure Laterality Date   CESAREAN SECTION     x2   JOINT REPLACEMENT     TONSILLECTOMY      Social History   Socioeconomic History   Marital status: Married    Spouse name: Not on file   Number of children: Not on file   Years of education: Not on file   Highest education level: Not on file  Occupational History   Not on file  Tobacco Use   Smoking status: Never   Smokeless tobacco: Never  Substance and Sexual Activity   Alcohol use: No   Drug use: No   Sexual activity: Not on file  Other Topics Concern   Not on file  Social History Narrative   Not on file   Social Determinants of Health   Financial Resource Strain: Not on file  Food Insecurity: Not on file  Transportation Needs: Not on file  Physical Activity: Not on file  Stress: Not on file  Social Connections: Not on file  Intimate Partner Violence: Not on file    Family History  Problem Relation Age of Onset   Depression Mother    Arthritis Mother    Heart disease Mother    Hypertension Mother    Hyperlipidemia Mother    Stroke Father  Review of Systems  Constitutional: Negative.  Negative for chills and fever.  HENT: Negative.  Negative for congestion and sore throat.   Respiratory: Negative.  Negative for cough and shortness of breath.   Cardiovascular: Negative.  Negative for chest pain and palpitations.  Gastrointestinal:  Negative for abdominal pain, diarrhea, nausea and vomiting.  Genitourinary: Negative.   Skin:  Positive for rash.  Neurological: Negative.  Negative for dizziness and headaches.  All other systems reviewed and are negative.  Today's Vitals   04/10/22 1520  BP: 128/74  Pulse: 71  Temp: (!) 97.5 F (36.4 C)  TempSrc: Oral  SpO2: 97%  Weight: 126 lb 2 oz (57.2 kg)  Height: 5' (1.524 m)   Body mass index is 24.63 kg/m.   Physical Exam Vitals  reviewed.  Constitutional:      Appearance: Normal appearance.  HENT:     Head: Normocephalic.     Mouth/Throat:     Mouth: Mucous membranes are moist.     Pharynx: Oropharynx is clear.  Eyes:     Extraocular Movements: Extraocular movements intact.     Pupils: Pupils are equal, round, and reactive to light.  Cardiovascular:     Rate and Rhythm: Normal rate and regular rhythm.  Pulmonary:     Effort: Pulmonary effort is normal.     Breath sounds: Normal breath sounds.  Chest:     Chest wall: Tenderness (Mild tenderness right anterior lower rib cage) present.  Abdominal:     Palpations: Abdomen is soft.     Tenderness: There is no abdominal tenderness.  Musculoskeletal:     Cervical back: No tenderness.  Lymphadenopathy:     Cervical: No cervical adenopathy.  Skin:    General: Skin is warm and dry.     Capillary Refill: Capillary refill takes less than 2 seconds.     Comments: Very mild hive lesions to both arms and very small area of erythema to right cheek area  Neurological:     General: No focal deficit present.     Mental Status: She is alert and oriented to person, place, and time.  Psychiatric:        Mood and Affect: Mood normal.        Behavior: Behavior normal.      ASSESSMENT & PLAN: Problem List Items Addressed This Visit       Musculoskeletal and Integument   Contusion of rib on right side - Primary    Recommended x-rays but patient refused. Tylenol and or NSAIDs as needed for pain      Relevant Orders   DG Ribs Unilateral W/Chest Right   Hives    Mild case.  No clinical concerns.  No red flag signs or symptoms. Small area in the face.  Recommend against use of facial corticosteroids. Requesting injection of corticosteroid but not available in the office today. Does not want to take oral steroids due to insomnia concerns. Advised to continue antihistamines as needed.      Patient Instructions  Chest Wall Pain Chest wall pain is pain in or  around the bones and muscles of your chest. Chest wall pain may be caused by: An injury. Coughing a lot. Using your chest and arm muscles too much. Sometimes, the cause may not be known. This pain may take a few weeks or longer to get better. Follow these instructions at home: Managing pain, stiffness, and swelling If told, put ice on the painful area: Put ice in a plastic  bag. Place a towel between your skin and the bag. Leave the ice on for 20 minutes, 2-3 times a day.  Activity Rest as told by your doctor. Avoid doing things that cause pain. This includes lifting heavy items. Ask your doctor what activities are safe for you. General instructions  Take over-the-counter and prescription medicines only as told by your doctor. Do not use any products that contain nicotine or tobacco, such as cigarettes, e-cigarettes, and chewing tobacco. If you need help quitting, ask your doctor. Keep all follow-up visits as told by your doctor. This is important. Contact a doctor if: You have a fever. Your chest pain gets worse. You have new symptoms. Get help right away if: You feel sick to your stomach (nauseous) or you throw up (vomit). You feel sweaty or light-headed. You have a cough with mucus from your lungs (sputum) or you cough up blood. You are short of breath. These symptoms may be an emergency. Do not wait to see if the symptoms will go away. Get medical help right away. Call your local emergency services (911 in the [place] Do not drive yourself to the hospital. Summary Chest wall pain is pain in or around the bones and muscles of your chest. It may be treated with ice, rest, and medicines. Your condition may also get better if you avoid doing things that cause pain. Contact a doctor if you have a fever, chest pain that gets worse, or new symptoms. Get help right away if you feel light-headed or you get short of breath. These symptoms may be an emergency. This information is not  intended to replace advice given to you by your health care provider. Make sure you discuss any questions you have with your health care provider. Document Revised: 12/11/2020 Document Reviewed: 11/23/2020 Elsevier Patient Education  2023 Elsevier Inc.     Edwina Barth, MD [facility]

## 2022-04-10 NOTE — Assessment & Plan Note (Signed)
Recommended x-rays but patient refused. Tylenol and or NSAIDs as needed for pain

## 2022-05-07 LAB — HM MAMMOGRAPHY: HM Mammogram: NORMAL (ref 0–4)

## 2022-05-07 LAB — HM PAP SMEAR

## 2022-05-12 ENCOUNTER — Telehealth: Payer: Self-pay | Admitting: Internal Medicine

## 2022-05-12 NOTE — Telephone Encounter (Signed)
Patient would like to transfer care from Dr. Okey Dupre to Dr. Yetta Barre - her husband is a patient of Dr. Yetta Barre and they both want to go to the same physician.

## 2022-05-12 NOTE — Telephone Encounter (Signed)
Fine with me

## 2022-05-15 ENCOUNTER — Ambulatory Visit: Payer: Medicare PPO

## 2022-05-15 ENCOUNTER — Encounter: Payer: Medicare PPO | Admitting: Internal Medicine

## 2022-06-02 ENCOUNTER — Other Ambulatory Visit: Payer: Self-pay | Admitting: Internal Medicine

## 2022-06-05 ENCOUNTER — Telehealth: Payer: Self-pay | Admitting: Internal Medicine

## 2022-06-05 ENCOUNTER — Other Ambulatory Visit: Payer: Self-pay | Admitting: *Deleted

## 2022-06-05 DIAGNOSIS — E782 Mixed hyperlipidemia: Secondary | ICD-10-CM

## 2022-06-05 MED ORDER — ROSUVASTATIN CALCIUM 10 MG PO TABS: 10.0000 mg | ORAL_TABLET | Freq: Every day | ORAL | 3 refills | Status: DC

## 2022-06-05 NOTE — Telephone Encounter (Signed)
sent 

## 2022-06-05 NOTE — Telephone Encounter (Signed)
Pt is requesting a refill of rosuvastatin (CRESTOR) 10 MG tablet.  Last OV 04-20-22 Please send to [facility] - [place], [place]  Phone:  [phone]  Fax:  [phone]

## 2022-06-27 DIAGNOSIS — H04123 Dry eye syndrome of bilateral lacrimal glands: Secondary | ICD-10-CM | POA: Diagnosis not present

## 2022-07-14 DIAGNOSIS — Z6824 Body mass index (BMI) 24.0-24.9, adult: Secondary | ICD-10-CM | POA: Diagnosis not present

## 2022-07-14 DIAGNOSIS — Z1231 Encounter for screening mammogram for malignant neoplasm of breast: Secondary | ICD-10-CM | POA: Diagnosis not present

## 2022-07-14 DIAGNOSIS — Z01419 Encounter for gynecological examination (general) (routine) without abnormal findings: Secondary | ICD-10-CM | POA: Diagnosis not present

## 2022-08-27 ENCOUNTER — Telehealth: Payer: Self-pay | Admitting: Internal Medicine

## 2022-08-27 NOTE — Telephone Encounter (Signed)
Left message for patient to call back to schedule Medicare Annual Wellness Visit   Last AWV  11/02/14  Please schedule at anytime with [facility] Advisor if patient calls the office back.     Any questions, please call me at [phone]

## 2022-09-01 ENCOUNTER — Ambulatory Visit: Payer: Medicare PPO | Admitting: Internal Medicine

## 2022-09-01 ENCOUNTER — Encounter: Payer: Self-pay | Admitting: Internal Medicine

## 2022-09-01 VITALS — BP 162/84 | HR 83 | Temp 98.0°F | Resp 16 | Ht 60.0 in | Wt 130.0 lb

## 2022-09-01 DIAGNOSIS — E2839 Other primary ovarian failure: Secondary | ICD-10-CM | POA: Diagnosis not present

## 2022-09-01 DIAGNOSIS — I10 Essential (primary) hypertension: Secondary | ICD-10-CM | POA: Diagnosis not present

## 2022-09-01 DIAGNOSIS — Z0000 Encounter for general adult medical examination without abnormal findings: Secondary | ICD-10-CM

## 2022-09-01 DIAGNOSIS — Z1211 Encounter for screening for malignant neoplasm of colon: Secondary | ICD-10-CM

## 2022-09-01 DIAGNOSIS — M8580 Other specified disorders of bone density and structure, unspecified site: Secondary | ICD-10-CM

## 2022-09-01 DIAGNOSIS — E785 Hyperlipidemia, unspecified: Secondary | ICD-10-CM

## 2022-09-01 LAB — HEPATIC FUNCTION PANEL
ALT: 16 U/L (ref 0–35)
AST: 18 U/L (ref 0–37)
Albumin: 4.6 g/dL (ref 3.5–5.2)
Alkaline Phosphatase: 56 U/L (ref 39–117)
Bilirubin, Direct: 0.1 mg/dL (ref 0.0–0.3)
Total Bilirubin: 0.3 mg/dL (ref 0.2–1.2)
Total Protein: 7.2 g/dL (ref 6.0–8.3)

## 2022-09-01 LAB — VITAMIN D 25 HYDROXY (VIT D DEFICIENCY, FRACTURES): VITD: 41.82 ng/mL (ref 30.00–100.00)

## 2022-09-01 LAB — URINALYSIS, ROUTINE W REFLEX MICROSCOPIC
Bilirubin Urine: NEGATIVE
Hgb urine dipstick: NEGATIVE
Ketones, ur: NEGATIVE
Leukocytes,Ua: NEGATIVE
Nitrite: NEGATIVE
Specific Gravity, Urine: 1.025 (ref 1.000–1.030)
Total Protein, Urine: NEGATIVE
Urine Glucose: NEGATIVE
Urobilinogen, UA: 0.2 (ref 0.0–1.0)
pH: 6 (ref 5.0–8.0)

## 2022-09-01 LAB — BASIC METABOLIC PANEL
BUN: 22 mg/dL (ref 6–23)
CO2: 30 mEq/L (ref 19–32)
Calcium: 9.3 mg/dL (ref 8.4–10.5)
Chloride: 103 mEq/L (ref 96–112)
Creatinine, Ser: 0.86 mg/dL (ref 0.40–1.20)
GFR: 70.79 mL/min (ref >60.00)
Glucose, Bld: 103 mg/dL — ABNORMAL HIGH (ref 70–99)
Potassium: 4.3 mEq/L (ref 3.5–5.1)
Sodium: 138 mEq/L (ref 135–145)

## 2022-09-01 LAB — LDL CHOLESTEROL, DIRECT: Direct LDL: 99 mg/dL

## 2022-09-01 LAB — LIPID PANEL
Cholesterol: 170 mg/dL (ref 0–200)
HDL: 44 mg/dL (ref >39.00)
NonHDL: 125.95
Total CHOL/HDL Ratio: 4
Triglycerides: 331 mg/dL — ABNORMAL HIGH (ref 0.0–149.0)
VLDL: 66.2 mg/dL — ABNORMAL HIGH (ref 0.0–40.0)

## 2022-09-01 LAB — TSH: TSH: 1.69 u[IU]/mL (ref 0.35–5.50)

## 2022-09-01 NOTE — Patient Instructions (Signed)

## 2022-09-01 NOTE — Progress Notes (Signed)
Subjective:  Patient ID: Beth Aguilar, female    DOB: 15-Jul-1956  Age: 65 y.o. MRN: [id number]  CC: Annual Exam, Hypertension, and Hyperlipidemia   HPI Beth Aguilar presents for a CPX and f/up -   She walks about 45 minutes a day.  Her endurance is good.  She denies headache, blurred vision, chest pain, shortness of breath, or edema.  Outpatient Medications Prior to Visit  Medication Sig Dispense Refill   aspirin 81 MG tablet Take 1 tablet (81 mg total) by mouth daily. 100 tablet 99   azelastine (ASTELIN) 0.1 % nasal spray PLACE 2 SPRAYS INTO EACH NOSTRIL 2 TIMES DAILY AS DIRECTED. 30 mL 2   rosuvastatin (CRESTOR) 10 MG tablet Take 1 tablet (10 mg total) by mouth daily. 90 tablet 3   triamcinolone cream (KENALOG) 0.1 % Apply 1 application. topically 2 (two) times daily. 30 g 0   No facility-administered medications prior to visit.    ROS Review of Systems  Constitutional: Negative.  Negative for appetite change, diaphoresis, fatigue and unexpected weight change.  HENT: Negative.    Eyes: Negative.   Respiratory:  Negative for apnea, cough, chest tightness, shortness of breath and wheezing.   Cardiovascular:  Negative for chest pain, palpitations and leg swelling.  Gastrointestinal:  Negative for abdominal pain, constipation, diarrhea, nausea and vomiting.  Endocrine: Negative.   Genitourinary: Negative.  Negative for difficulty urinating.  Musculoskeletal:  Negative for arthralgias, joint swelling and myalgias.  Skin: Negative.   Neurological: Negative.  Negative for dizziness, weakness, numbness and headaches.  Hematological:  Negative for adenopathy. Does not bruise/bleed easily.  Psychiatric/Behavioral:  Positive for sleep disturbance. Negative for confusion, decreased concentration, dysphoric mood and suicidal ideas.     Objective:  BP (!) 162/84 (BP Location: Left Arm, Patient Position: Sitting, Cuff Size: Large)   Pulse 83   Temp 98 F (36.7 C) (Oral)   Resp 16    Ht 5' (1.524 m)   Wt 130 lb (59 kg)   SpO2 96%   BMI 25.39 kg/m   BP Readings from Last 3 Encounters:  09/01/22 (!) 162/84  04/10/22 128/74  01/17/22 132/80    Wt Readings from Last 3 Encounters:  09/01/22 130 lb (59 kg)  04/10/22 126 lb 2 oz (57.2 kg)  01/17/22 129 lb (58.5 kg)    Physical Exam Vitals reviewed.  Constitutional:      Appearance: She is not ill-appearing.  HENT:     Nose: Nose normal.     Mouth/Throat:     Mouth: Mucous membranes are moist.  Eyes:     General: No scleral icterus.    Conjunctiva/sclera: Conjunctivae normal.  Cardiovascular:     Rate and Rhythm: Normal rate and regular rhythm.     Heart sounds: No murmur heard.    No gallop.     Comments: EKG- NSR, 70 bpm No LVH Normal EKG Pulmonary:     Effort: Pulmonary effort is normal.     Breath sounds: No stridor. No wheezing, rhonchi or rales.  Abdominal:     General: Abdomen is flat.     Palpations: There is no mass.     Tenderness: There is no abdominal tenderness. There is no guarding or rebound.     Hernia: No hernia is present.  Musculoskeletal:        General: No swelling. Normal range of motion.     Cervical back: Neck supple.     Right lower leg: No edema.  Left lower leg: No edema.  Lymphadenopathy:     Cervical: No cervical adenopathy.  Skin:    General: Skin is warm and dry.  Neurological:     General: No focal deficit present.     Mental Status: She is alert. Mental status is at baseline.  Psychiatric:        Mood and Affect: Mood normal.        Behavior: Behavior normal.     Lab Results  Component Value Date   WBC 6.6 10/03/2020   HGB 14.4 10/03/2020   HCT 42.3 10/03/2020   PLT 182.0 10/03/2020   GLUCOSE 103 (H) 09/01/2022   CHOL 170 09/01/2022   TRIG 331.0 (H) 09/01/2022   HDL 44.00 09/01/2022   LDLDIRECT 99.0 09/01/2022   LDLCALC 98 05/14/2021   ALT 16 09/01/2022   AST 18 09/01/2022   NA 138 09/01/2022   K 4.3 09/01/2022   CL 103 09/01/2022    CREATININE 0.86 09/01/2022   BUN 22 09/01/2022   CO2 30 09/01/2022   TSH 1.69 09/01/2022   HGBA1C 5.6 10/03/2020    MR Brain Wo Contrast  Result Date: 08/30/2021 CLINICAL DATA:  Provided history: Memory loss. Additional history provided by scanning technologist: [facility] loss for 1 year, confusion. EXAM: MRI HEAD WITHOUT CONTRAST TECHNIQUE: Multiplanar, multiecho pulse sequences of the brain and surrounding structures were obtained without intravenous contrast. COMPARISON:  Cervical spine MRI 12/14/2020. FINDINGS: Brain: Cerebral volume is normal for age. Mild multifocal T2 FLAIR hyperintense signal abnormality within the cerebral white matter, nonspecific but most often secondary to chronic small vessel ischemia. No cortical encephalomalacia is identified. There is no acute infarct. No evidence of an intracranial mass. No chronic intracranial blood products. No extra-axial fluid collection. No midline shift. Vascular: Maintained flow voids within the proximal large arterial vessels. Skull and upper cervical spine: No focal suspicious marrow lesion. Incompletely assessed cervical spondylosis. Sinuses/Orbits: Visualized orbits show no acute finding. Trace mucosal thickening within the bilateral ethmoid sinuses. IMPRESSION: No evidence of acute intracranial abnormality. Mild multifocal T2 FLAIR hyperintense signal abnormality within the cerebral white matter, nonspecific but most often secondary to chronic small vessel ischemia. Otherwise unremarkable non-contrast MRI appearance of the brain for age. Electronically Signed   By: Jackey Loge D.O.   On: 08/30/2021 07:54    Assessment & Plan:   Beth Aguilar was seen today for annual exam, hypertension and hyperlipidemia.  Diagnoses and all orders for this visit:  Routine general medical examination at a health care facility- Exam completed, labs reviewed, vaccines reviewed and updated, cancer screenings addressed, patient education was given.  Dyslipidemia,  goal LDL below 100- She has a low ASCVD risk score.  Statin is not indicated. -     Lipid panel; Future -     TSH; Future -     Hepatic function panel; Future -     Hepatic function panel -     TSH -     Lipid panel  Osteopenia, unspecified location -     VITAMIN D 25 Hydroxy (Vit-D Deficiency, Fractures); Future -     Cancel: DG Bone Density; Future -     VITAMIN D 25 Hydroxy (Vit-D Deficiency, Fractures)  Primary hypertension- Her EKG is negative for LVH.  Labs are negative for secondary causes or endorgan damage.  There may be a Production assistant, radio.  Will start an ARB. -     Urinalysis, Routine w reflex microscopic; Future -     Hepatic function panel; Future -  Basic metabolic panel; Future -     Aldosterone + renin activity w/ ratio; Future -     EKG 12-Lead -     Aldosterone + renin activity w/ ratio -     Basic metabolic panel -     Hepatic function panel -     Urinalysis, Routine w reflex microscopic -     olmesartan (BENICAR) 20 MG tablet; Take 1 tablet (20 mg total) by mouth daily.  Estrogen deficiency -     Cancel: DG Bone Density; Future  Screen for colon cancer -     Cologuard  Other orders -     LDL cholesterol, direct   I am having Beth Aguilar start on olmesartan. I am also having her maintain her aspirin, azelastine, triamcinolone cream, and rosuvastatin.  Meds ordered this encounter  Medications   olmesartan (BENICAR) 20 MG tablet    Sig: Take 1 tablet (20 mg total) by mouth daily.    Dispense:  90 tablet    Refill:  0     Follow-up: Return in about 6 months (around 03/03/2023).  Sanda Linger, MD

## 2022-09-02 ENCOUNTER — Other Ambulatory Visit: Payer: Self-pay | Admitting: Internal Medicine

## 2022-09-02 DIAGNOSIS — M8580 Other specified disorders of bone density and structure, unspecified site: Secondary | ICD-10-CM

## 2022-09-02 DIAGNOSIS — E2839 Other primary ovarian failure: Secondary | ICD-10-CM

## 2022-09-02 MED ORDER — OLMESARTAN MEDOXOMIL 20 MG PO TABS: 20.0000 mg | ORAL_TABLET | Freq: Every day | ORAL | 0 refills | Status: DC

## 2022-09-05 LAB — ALDOSTERONE + RENIN ACTIVITY W/ RATIO
ALDO / PRA Ratio: 9.5 Ratio (ref 0.9–28.9)
Aldosterone: 6 ng/dL
Renin Activity: 0.63 ng/mL/h (ref 0.25–5.82)

## 2022-09-07 ENCOUNTER — Encounter: Payer: Self-pay | Admitting: Internal Medicine

## 2022-09-10 DIAGNOSIS — Z1211 Encounter for screening for malignant neoplasm of colon: Secondary | ICD-10-CM | POA: Diagnosis not present

## 2022-09-11 ENCOUNTER — Ambulatory Visit (INDEPENDENT_AMBULATORY_CARE_PROVIDER_SITE_OTHER): Payer: Medicare PPO

## 2022-09-11 VITALS — Ht 60.0 in | Wt 126.0 lb

## 2022-09-11 DIAGNOSIS — Z0000 Encounter for general adult medical examination without abnormal findings: Secondary | ICD-10-CM | POA: Diagnosis not present

## 2022-09-11 NOTE — Progress Notes (Signed)
Virtual Visit via Telephone Note  I connected with  Beth Aguilar on 09/11/22 at  2:30 PM EST by telephone and verified that I am speaking with the correct person using two identifiers.  Location: Patient: Home Provider: [place] Persons participating in the virtual visit: [employer]   I discussed the limitations, risks, security and privacy concerns of performing an evaluation and management service by telephone and the availability of in person appointments. The patient expressed understanding and agreed to proceed.  Interactive audio and video telecommunications were attempted between this nurse and patient, however failed, due to patient having technical difficulties OR patient did not have access to video capability.  We continued and completed visit with audio only.  Some vital signs may be absent or patient reported.   Sheral Flow, LPN  Subjective:   Beth Aguilar is a 66 y.o. female who presents for an Initial Medicare Annual Wellness Visit.  Review of Systems     Cardiac Risk Factors include: advanced age (>42men, >54 women)     Objective:    Today's Vitals   09/11/22 1436  Weight: 126 lb (57.2 kg)  Height: 5' (1.524 m)  PainSc: 0-No pain   Body mass index is 24.61 kg/m.     09/11/2022    2:38 PM 10/12/2019    5:40 PM  Advanced Directives  Does Patient Have a Medical Advance Directive? No No  Would patient like information on creating a medical advance directive? No - Patient declined No - Patient declined    Current Medications (verified) Outpatient Encounter Medications as of 09/11/2022  Medication Sig   aspirin 81 MG tablet Take 1 tablet (81 mg total) by mouth daily.   azelastine (ASTELIN) 0.1 % nasal spray PLACE 2 SPRAYS INTO EACH NOSTRIL 2 TIMES DAILY AS DIRECTED.   olmesartan (BENICAR) 20 MG tablet Take 1 tablet (20 mg total) by mouth daily.   rosuvastatin (CRESTOR) 10 MG tablet Take 1 tablet (10 mg total) by mouth  daily.   triamcinolone cream (KENALOG) 0.1 % Apply 1 application. topically 2 (two) times daily.   No facility-administered encounter medications on file as of 09/11/2022.    Allergies (verified) Flonase [fluticasone propionate], Compazine [prochlorperazine], Gadolinium derivatives, Guaifenesin & derivatives, and Sulfa antibiotics   History: Past Medical History:  Diagnosis Date   Kidney stone    Past Surgical History:  Procedure Laterality Date   CESAREAN SECTION     x2   JOINT REPLACEMENT     TONSILLECTOMY     Family History  Problem Relation Age of Onset   Depression Mother    Arthritis Mother    Heart disease Mother    Hypertension Mother    Hyperlipidemia Mother    Stroke Father    Social History   Socioeconomic History   Marital status: Married    Spouse name: Not on file   Number of children: Not on file   Years of education: Not on file   Highest education level: Not on file  Occupational History   Not on file  Tobacco Use   Smoking status: Never    Passive exposure: Never   Smokeless tobacco: Never  Substance and Sexual Activity   Alcohol use: No   Drug use: No   Sexual activity: Yes    Partners: Male  Other Topics Concern   Not on file  Social History Narrative   Not on file   Social Determinants of Health   Financial Resource Strain: Low Risk  (  09/11/2022)   Overall Financial Resource Strain (CARDIA)    Difficulty of Paying Living Expenses: Not hard at all  Food Insecurity: No Food Insecurity (09/11/2022)   Hunger Vital Sign    Worried About Running Out of Food in the Last Year: Never true    Ran Out of Food in the Last Year: Never true  Transportation Needs: No Transportation Needs (09/11/2022)   PRAPARE - Hydrologist (Medical): No    Lack of Transportation (Non-Medical): No  Physical Activity: Inactive (09/11/2022)   Exercise Vital Sign    Days of Exercise per Week: 0 days    Minutes of Exercise per  Session: 0 min  Stress: No Stress Concern Present (09/11/2022)   Liberal    Feeling of Stress : Not at all  Social Connections: [place] (09/11/2022)   Social Connection and Isolation Panel [NHANES]    Frequency of Communication with Friends and Family: More than three times a week    Frequency of Social Gatherings with Friends and Family: More than three times a week    Attends Religious Services: More than 4 times per year    Active Member of [employer] or Organizations: Yes    Attends Music therapist: More than 4 times per year    Marital Status: Married    Tobacco Counseling Counseling given: Not Answered   Clinical Intake:  Pre-visit preparation completed: Yes  Pain : No/denies pain Pain Score: 0-No pain     BMI - recorded: 24.61 Nutritional Status: BMI of 19-24  Normal Nutritional Risks: None Diabetes: No  How often do you need to have someone help you when you read instructions, pamphlets, or other written materials from your doctor or pharmacy?: 1 - Never What is the last grade level you completed in school?: HSG; 6 years of college (2 degrees)  Diabetic? no  Interpreter Needed?: No  Information entered by :: Lisette Abu, LPN.   Activities of Daily Living    09/11/2022    2:41 PM  In your present state of health, do you have any difficulty performing the following activities:  Hearing? 0  Vision? 0  Difficulty concentrating or making decisions? 0  Walking or climbing stairs? 0  Dressing or bathing? 0  Doing errands, shopping? 0  Preparing Food and eating ? N  Using the Toilet? N  In the past six months, have you accidently leaked urine? N  Do you have problems with loss of bowel control? N  Managing your Medications? N  Managing your Finances? N  Housekeeping or managing your Housekeeping? N    Patient Care Team: Janith Lima, MD as PCP - General  (Internal Medicine)  Indicate any recent Medical Services you may have received from other than [facility] providers in the past year (date may be approximate).     Assessment:   This is a routine wellness examination for Beth Aguilar.  Hearing/Vision screen Hearing Screening - Comments:: Patient has hearing difficulties; but no hearings at this time. Vision Screening - Comments:: Wears rx glasses - up to date with routine eye exams with [facility].   Dietary issues and exercise activities discussed: Current Exercise Habits: The patient does not participate in regular exercise at present, Exercise limited by: None identified   Goals Addressed             This Visit's Progress    My goal for 2024 is to  get back into an exercise program.        Depression Screen    09/11/2022    2:44 PM 04/10/2022    3:23 PM 01/17/2022   10:49 AM 11/14/2021    4:49 PM 11/05/2020    2:55 PM 08/10/2019    4:42 PM 09/06/2018    3:12 PM  PHQ 2/9 Scores  PHQ - 2 Score 0 0 0 0 0 0 0    Fall Risk    09/11/2022    2:39 PM 04/10/2022    3:22 PM 01/17/2022   10:49 AM  Fall Risk   Falls in the past year? 0 1 0  Number falls in past yr: 0 0 0  Injury with Fall? 0 1 0  Risk for fall due to : No Fall Risks  No Fall Risks  Follow up Falls prevention discussed  Falls evaluation completed    FALL RISK PREVENTION PERTAINING TO THE HOME:  Any stairs in or around the home? No  If so, are there any without handrails? No  Home free of loose throw rugs in walkways, pet beds, electrical cords, etc? Yes  Adequate lighting in your home to reduce risk of falls? Yes   ASSISTIVE DEVICES UTILIZED TO PREVENT FALLS:  Life alert? No  Use of a cane, walker or w/c? No  Grab bars in the bathroom? Yes  Shower chair or bench in shower? Yes  Elevated toilet seat or a handicapped toilet? Yes   TIMED UP AND GO: Phone Visit  Was the test performed? No .    Cognitive Function:        09/11/2022    2:42 PM   6CIT Screen  What Year? 0 points  What month? 0 points  What time? 0 points  Count back from 20 0 points  Months in reverse 0 points  Repeat phrase 0 points  Total Score 0 points    Immunizations Immunization History  Administered Date(s) Administered   Fluad Quad(high Dose 65+) 09/02/2021   Influenza, High Dose Seasonal PF 08/25/2022   Influenza,inj,Quad PF,6+ Mos 06/22/2018, 07/23/2020   Influenza-Unspecified 06/20/2019   PFIZER Comirnaty(Gray Top)Covid-19 Tri-Sucrose Vaccine 01/29/2021   PFIZER(Purple Top)SARS-COV-2 Vaccination 11/19/2019, 12/10/2019   Tdap 04/23/2014   Zoster Recombinat (Shingrix) 10/03/2020, 12/03/2020    TDAP status: Up to date  Flu Vaccine status: Up to date  Pneumococcal vaccine status: Due, Education has been provided regarding the importance of this vaccine. Advised may receive this vaccine at local pharmacy or Health Dept. Aware to provide a copy of the vaccination record if obtained from local pharmacy or Health Dept. Verbalized acceptance and understanding.  Covid-19 vaccine status: Completed vaccines  Qualifies for Shingles Vaccine? Yes   Zostavax completed No   Shingrix Completed?: Yes  Screening Tests Health Maintenance  Topic Date Due   HIV Screening  Never done   COVID-19 Vaccine (4 - 2023-24 season) 09/17/2022 (Originally 05/23/2022)   Pneumonia Vaccine 57+ Years old (1 - PCV) 04/11/2023 (Originally 01/12/2022)   Medicare Annual Wellness (AWV)  09/12/2023   COLONOSCOPY (Pts 45-33yrs Insurance coverage will need to be confirmed)  02/21/2024   DTaP/Tdap/Td (2 - Td or Tdap) 04/23/2024   MAMMOGRAM  05/07/2024   PAP SMEAR-Modifier  05/07/2025   INFLUENZA VACCINE  Completed   DEXA SCAN  Completed   Hepatitis C Screening  Completed   Zoster Vaccines- Shingrix  Completed   HPV VACCINES  Aged Out    Health Maintenance  Health Maintenance Due  Topic  Date Due   HIV Screening  Never done    Colorectal cancer screening: Patient just  completed Cologuard testing.  Mammogram status: Completed 05/07/2022. Repeat every year  Bone Density status: scheduled for 02/20/2023  Lung Cancer Screening: (Low Dose CT Chest recommended if Age 58-80 years, 30 pack-year currently smoking OR have quit w/in 15years.) does not qualify.   Lung Cancer Screening Referral: no  Additional Screening:  Hepatitis C Screening: does qualify; Completed 05/09/2016  Vision Screening: Recommended annual ophthalmology exams for early detection of glaucoma and other disorders of the eye. Is the patient up to date with their annual eye exam?  Yes  Who is the provider or what is the name of the office in which the patient attends annual eye exams? [facility] If pt is not established with a provider, would they like to be referred to a provider to establish care? No .   Dental Screening: Recommended annual dental exams for proper oral hygiene  Community Resource Referral / Chronic Care Management: CRR required this visit?  No   CCM required this visit?  No      Plan:     I have personally reviewed and noted the following in the patient's chart:   Medical and social history Use of alcohol, tobacco or illicit drugs  Current medications and supplements including opioid prescriptions. Patient is not currently taking opioid prescriptions. Functional ability and status Nutritional status Physical activity Advanced directives List of other physicians Hospitalizations, surgeries, and ER visits in previous 12 months Vitals Screenings to include cognitive, depression, and falls Referrals and appointments  In addition, I have reviewed and discussed with patient certain preventive protocols, quality metrics, and best practice recommendations. A written personalized care plan for preventive services as well as general preventive health recommendations were provided to patient.     Sheral Flow, LPN   [id number]   Nurse Notes:  N/A

## 2022-09-11 NOTE — Patient Instructions (Signed)
Beth Aguilar , Thank you for taking time to come for your Medicare Wellness Visit. I appreciate your ongoing commitment to your health goals. Please review the following plan we discussed and let me know if I can assist you in the future.   These are the goals we discussed:  Goals      My goal for 2024 is to get back into an exercise program.        This is a list of the screening recommended for you and due dates:  Health Maintenance  Topic Date Due   HIV Screening  Never done   COVID-19 Vaccine (4 - 2023-24 season) 09/17/2022*   Pneumonia Vaccine (1 - PCV) 04/11/2023*   Medicare Annual Wellness Visit  09/12/2023   Colon Cancer Screening  02/21/2024   DTaP/Tdap/Td vaccine (2 - Td or Tdap) 04/23/2024   Mammogram  05/07/2024   Pap Smear  05/07/2025   Flu Shot  Completed   DEXA scan (bone density measurement)  Completed   Hepatitis C Screening: USPSTF Recommendation to screen - Ages 18-79 yo.  Completed   Zoster (Shingles) Vaccine  Completed   HPV Vaccine  Aged Out  *Topic was postponed. The date shown is not the original due date.    Advanced directives: No  Conditions/risks identified: Yes  Next appointment: Follow up in one year for your annual wellness visit.   Preventive Care 65 Years and Older, Female Preventive care refers to lifestyle choices and visits with your health care provider that can promote health and wellness. What does preventive care include? A yearly physical exam. This is also called an annual well check. Dental exams once or twice a year. Routine eye exams. Ask your health care provider how often you should have your eyes checked. Personal lifestyle choices, including: Daily care of your teeth and gums. Regular physical activity. Eating a healthy diet. Avoiding tobacco and drug use. Limiting alcohol use. Practicing safe sex. Taking low-dose aspirin every day. Taking vitamin and mineral supplements as recommended by your health care provider. What  happens during an annual well check? The services and screenings done by your health care provider during your annual well check will depend on your age, overall health, lifestyle risk factors, and family history of disease. Counseling  Your health care provider may ask you questions about your: Alcohol use. Tobacco use. Drug use. Emotional well-being. Home and relationship well-being. Sexual activity. Eating habits. History of falls. Memory and ability to understand (cognition). Work and work Astronomer. Reproductive health. Screening  You may have the following tests or measurements: Height, weight, and BMI. Blood pressure. Lipid and cholesterol levels. These may be checked every 5 years, or more frequently if you are over 76 years old. Skin check. Lung cancer screening. You may have this screening every year starting at age 57 if you have a 30-pack-year history of smoking and currently smoke or have quit within the past 15 years. Fecal occult blood test (FOBT) of the stool. You may have this test every year starting at age 10. Flexible sigmoidoscopy or colonoscopy. You may have a sigmoidoscopy every 5 years or a colonoscopy every 10 years starting at age 24. Hepatitis C blood test. Hepatitis B blood test. Sexually transmitted disease (STD) testing. Diabetes screening. This is done by checking your blood sugar (glucose) after you have not eaten for a while (fasting). You may have this done every 1-3 years. Bone density scan. This is done to screen for osteoporosis. You may have this  done starting at age 95. Mammogram. This may be done every 1-2 years. Talk to your health care provider about how often you should have regular mammograms. Talk with your health care provider about your test results, treatment options, and if necessary, the need for more tests. Vaccines  Your health care provider may recommend certain vaccines, such as: Influenza vaccine. This is recommended every  year. Tetanus, diphtheria, and acellular pertussis (Tdap, Td) vaccine. You may need a Td booster every 10 years. Zoster vaccine. You may need this after age 20. Pneumococcal 13-valent conjugate (PCV13) vaccine. One dose is recommended after age 13. Pneumococcal polysaccharide (PPSV23) vaccine. One dose is recommended after age 81. Talk to your health care provider about which screenings and vaccines you need and how often you need them. This information is not intended to replace advice given to you by your health care provider. Make sure you discuss any questions you have with your health care provider. Document Released: 10/05/2015 Document Revised: 05/28/2016 Document Reviewed: 07/10/2015 Elsevier Interactive Patient Education  2017 [employer] Prevention in the Home Falls can cause injuries. They can happen to people of all ages. There are many things you can do to make your home safe and to help prevent falls. What can I do on the outside of my home? Regularly fix the edges of walkways and driveways and fix any cracks. Remove anything that might make you trip as you walk through a door, such as a raised step or threshold. Trim any bushes or trees on the path to your home. Use bright outdoor lighting. Clear any walking paths of anything that might make someone trip, such as rocks or tools. Regularly check to see if handrails are loose or broken. Make sure that both sides of any steps have handrails. Any raised decks and porches should have guardrails on the edges. Have any leaves, snow, or ice cleared regularly. Use sand or salt on walking paths during winter. Clean up any spills in your garage right away. This includes oil or grease spills. What can I do in the bathroom? Use night lights. Install grab bars by the toilet and in the tub and shower. Do not use towel bars as grab bars. Use non-skid mats or decals in the tub or shower. If you need to sit down in the shower, use a  plastic, non-slip stool. Keep the floor dry. Clean up any water that spills on the floor as soon as it happens. Remove soap buildup in the tub or shower regularly. Attach bath mats securely with double-sided non-slip rug tape. Do not have throw rugs and other things on the floor that can make you trip. What can I do in the bedroom? Use night lights. Make sure that you have a light by your bed that is easy to reach. Do not use any sheets or blankets that are too big for your bed. They should not hang down onto the floor. Have a firm chair that has side arms. You can use this for support while you get dressed. Do not have throw rugs and other things on the floor that can make you trip. What can I do in the kitchen? Clean up any spills right away. Avoid walking on wet floors. Keep items that you use a lot in easy-to-reach places. If you need to reach something above you, use a strong step stool that has a grab bar. Keep electrical cords out of the way. Do not use floor polish or wax  that makes floors slippery. If you must use wax, use non-skid floor wax. Do not have throw rugs and other things on the floor that can make you trip. What can I do with my stairs? Do not leave any items on the stairs. Make sure that there are handrails on both sides of the stairs and use them. Fix handrails that are broken or loose. Make sure that handrails are as long as the stairways. Check any carpeting to make sure that it is firmly attached to the stairs. Fix any carpet that is loose or worn. Avoid having throw rugs at the top or bottom of the stairs. If you do have throw rugs, attach them to the floor with carpet tape. Make sure that you have a light switch at the top of the stairs and the bottom of the stairs. If you do not have them, ask someone to add them for you. What else can I do to help prevent falls? Wear shoes that: Do not have high heels. Have rubber bottoms. Are comfortable and fit you  well. Are closed at the toe. Do not wear sandals. If you use a stepladder: Make sure that it is fully opened. Do not climb a closed stepladder. Make sure that both sides of the stepladder are locked into place. Ask someone to hold it for you, if possible. Clearly mark and make sure that you can see: Any grab bars or handrails. First and last steps. Where the edge of each step is. Use tools that help you move around (mobility aids) if they are needed. These include: Canes. Walkers. Scooters. Crutches. Turn on the lights when you go into a dark area. Replace any light bulbs as soon as they burn out. Set up your furniture so you have a clear path. Avoid moving your furniture around. If any of your floors are uneven, fix them. If there are any pets around you, be aware of where they are. Review your medicines with your doctor. Some medicines can make you feel dizzy. This can increase your chance of falling. Ask your doctor what other things that you can do to help prevent falls. This information is not intended to replace advice given to you by your health care provider. Make sure you discuss any questions you have with your health care provider. Document Released: 07/05/2009 Document Revised: 02/14/2016 Document Reviewed: 10/13/2014 Elsevier Interactive Patient Education  2017 [employer].

## 2022-09-18 ENCOUNTER — Encounter: Payer: Self-pay | Admitting: Internal Medicine

## 2022-09-18 ENCOUNTER — Other Ambulatory Visit: Payer: Self-pay | Admitting: Internal Medicine

## 2022-09-18 DIAGNOSIS — U071 COVID-19: Secondary | ICD-10-CM

## 2022-09-18 LAB — COLOGUARD: COLOGUARD: NEGATIVE

## 2022-09-18 MED ORDER — MOLNUPIRAVIR EUA 200MG CAPSULE: 4.0000 | ORAL_CAPSULE | Freq: Two times a day (BID) | ORAL | 0 refills | Status: AC

## 2022-09-22 ENCOUNTER — Encounter: Payer: Self-pay | Admitting: Internal Medicine

## 2022-09-24 ENCOUNTER — Encounter: Payer: Self-pay | Admitting: Family Medicine

## 2022-09-24 ENCOUNTER — Ambulatory Visit: Payer: Medicare PPO | Admitting: Family Medicine

## 2022-09-24 VITALS — BP 118/74 | HR 67 | Temp 97.7°F | Ht 60.0 in | Wt 129.0 lb

## 2022-09-24 DIAGNOSIS — U071 COVID-19: Secondary | ICD-10-CM

## 2022-09-24 DIAGNOSIS — H6692 Otitis media, unspecified, left ear: Secondary | ICD-10-CM | POA: Diagnosis not present

## 2022-09-24 DIAGNOSIS — R051 Acute cough: Secondary | ICD-10-CM

## 2022-09-24 DIAGNOSIS — L282 Other prurigo: Secondary | ICD-10-CM | POA: Diagnosis not present

## 2022-09-24 LAB — POC COVID19 BINAXNOW: SARS Coronavirus 2 Ag: NEGATIVE

## 2022-09-24 MED ORDER — OFLOXACIN 0.3 % OT SOLN: 10.0000 [drp] | Freq: Every day | OTIC | 0 refills | Status: DC

## 2022-09-24 NOTE — Patient Instructions (Signed)
Let me know if your symptoms are getting worse or if you are not improving in the next week after using the ear drops.

## 2022-09-24 NOTE — Progress Notes (Signed)
Subjective:  Beth Aguilar is a 67 y.o. female who presents for a 1 wk hx of left ear pain and pressure. Intermittent decreased hearing. Cough x 1 wk  States she tested positive for Covid 9 days ago. Wants to be tested today to see if she is still positive. States she took all but one dose of antiviral and then stopped due to pruritic rash on her torso and underarms. Rash is improving with Allegra. Has upcoming appt with allergist.     No other aggravating or relieving factors.  No other c/o.  ROS as in subjective.   Objective: Vitals:   09/24/22 1125  BP: 118/74  Pulse: 67  Temp: 97.7 F (36.5 C)  SpO2: 98%    General appearance: Alert, WD/WN, no distress, mildly ill appearing                             Skin: warm, no rash                           Head: no sinus tenderness                            Eyes: conjunctiva normal, corneas clear, PERRLA                            Ears: left TM with erythema, dull, normal appearing right TM, external ear canals normal                                    Neck: supple, no adenopathy, no thyromegaly, nontender                          Heart: RRR                         Lungs: CTA bilaterally, no wheezes, rales, or rhonchi      Assessment: Acute otitis media, left - Plan: ofloxacin (FLOXIN) 0.3 % OTIC solution  Acute cough - Plan: POC COVID-19  COVID-19 virus infection  Pruritic rash   Plan: Ofloxacin prescribed. Hx of decreased hearing with topical Flonase.   9 days from positive Covid test. Other symptoms most likely viral at this time.  Continue Allegra for rash and itching. See allergist as scheduled next week.  Call/return in 4-5 days if symptoms aren't resolving.

## 2022-09-30 ENCOUNTER — Encounter: Payer: Self-pay | Admitting: Allergy and Immunology

## 2022-09-30 ENCOUNTER — Other Ambulatory Visit: Payer: Self-pay

## 2022-09-30 ENCOUNTER — Ambulatory Visit: Payer: Medicare PPO | Admitting: Allergy and Immunology

## 2022-09-30 VITALS — BP 128/80 | HR 79 | Resp 18 | Ht 59.0 in | Wt 129.2 lb

## 2022-09-30 DIAGNOSIS — T781XXA Other adverse food reactions, not elsewhere classified, initial encounter: Secondary | ICD-10-CM | POA: Diagnosis not present

## 2022-09-30 MED ORDER — EPINEPHRINE 0.3 MG/0.3ML IJ SOAJ: 0.3000 mg | INTRAMUSCULAR | 1 refills | Status: AC | PRN

## 2022-09-30 NOTE — Patient Instructions (Signed)
  1.  Allergen avoidance measures???  2.  EpiPen, Benadryl, MD/ER evaluation for allergic reaction  3.  Blood -nut panel with reflex  4.  Further evaluation???

## 2022-09-30 NOTE — Progress Notes (Signed)
[name]   NEW PATIENT NOTE  Referring Provider: Hoyt Koch, * Primary Provider: Janith Lima, MD Date of office visit: 09/30/2022    Subjective:   Chief Complaint:  Beth Aguilar (DOB: 1956-04-08) is a 67 y.o. female who presents to the clinic on 09/30/2022 with a chief complaint of Allergy Testing (Peanut / tree nut cause hives and itching ) and Allergic Reaction (2 months ago on the way to church she ate a macadamia nut cookie within 2 hours had stomach itching and the hives started to move all over her body took 2 allegra and It helped. [place] ate a nut bread and on the way home had itching and hives took 2 allegra and it helped. 3rd time broke out in hives after eating peanut butter  ) .     HPI: [name] presents to this clinic in evaluation of possible food reaction.  With in a 1 week period of time she developed 3 episodes of global urticaria without any associated systemic or constitutional symptoms around the 2023 Thanksgiving day holiday.  Each 1 of these reactions was associated with the consumption of a nut or peanut.  Her first reaction was with macadamia nuts.  She had a macadamia nut cookie and within 2 hours she developed global urticaria and she took some Allegra and it lasted greater than 5 hours.  The next day she ate fruit cake with peanut butter and within 1 hour she had global urticaria that lasted about 2 hours.  2 days later she ate peanut butter crackers and within 1 hour she developed urticaria and took some Allegra and it lasted 2 hours.  She has been without consumption of tree nuts or peanuts since that point in time.  She does not really have an atopic history other than the fact that she has some allergic rhinitis with cat and dog exposure that was evaluated approximately 20 years ago and as long as she is careful about exposure she really has no problems and no need to use any  medications.  She has received this years flu vaccine.  She recently had an episode of COVID treated with an antiviral agent.  Past Medical History:  Diagnosis Date   Kidney stone    Urticaria     Past Surgical History:  Procedure Laterality Date   ADENOIDECTOMY     CESAREAN SECTION     x2   JOINT REPLACEMENT     TONSILLECTOMY      Allergies as of 09/30/2022       Reactions   Flonase [fluticasone Propionate] Other (See Comments)   hearing   Compazine [prochlorperazine]    Gadolinium Derivatives    Guaifenesin & Derivatives    Felt drunk   Sulfa Antibiotics         Medication List    aspirin 81 MG tablet Take 1 tablet (81 mg total) by mouth daily.   azelastine 0.1 % nasal spray Commonly known as: ASTELIN PLACE 2 SPRAYS INTO EACH NOSTRIL 2 TIMES DAILY AS DIRECTED.   ofloxacin 0.3 % OTIC solution Commonly known as: FLOXIN Place 10 drops into the left ear daily.   olmesartan 20 MG tablet Commonly known as: BENICAR Take 1 tablet (20 mg total) by mouth daily.   rosuvastatin 10 MG tablet Commonly known as: CRESTOR Take 1 tablet (10 mg total) by mouth daily.   triamcinolone cream 0.1 % Commonly known as: KENALOG Apply 1 application.  topically 2 (two) times daily.    Review of systems negative except as noted in HPI / PMHx or noted below:  Review of Systems  Constitutional: Negative.   HENT: Negative.    Eyes: Negative.   Respiratory: Negative.    Cardiovascular: Negative.   Gastrointestinal: Negative.   Genitourinary: Negative.   Musculoskeletal: Negative.   Skin: Negative.   Neurological: Negative.   Endo/Heme/Allergies: Negative.   Psychiatric/Behavioral: Negative.      Family History  Problem Relation Age of Onset   Depression Mother    Arthritis Mother    Heart disease Mother    Hypertension Mother    Hyperlipidemia Mother    Stroke Father     Social History   Socioeconomic History   Marital status: Married    Spouse name: Not on  file   Number of children: Not on file   Years of education: Not on file   Highest education level: Not on file  Occupational History   Not on file  Tobacco Use   Smoking status: Never    Passive exposure: Never   Smokeless tobacco: Never  Substance and Sexual Activity   Alcohol use: No   Drug use: No   Sexual activity: Yes    Partners: Male  Other Topics Concern   Not on file  Social History Narrative   Not on file   Social Determinants of Health   Financial Resource Strain: Low Risk  (09/11/2022)   Overall Financial Resource Strain (CARDIA)    Difficulty of Paying Living Expenses: Not hard at all  Food Insecurity: No Food Insecurity (09/11/2022)   Hunger Vital Sign    Worried About Running Out of Food in the Last Year: Never true    Ran Out of Food in the Last Year: Never true  Transportation Needs: No Transportation Needs (09/11/2022)   PRAPARE - Administrator, Civil Service (Medical): No    Lack of Transportation (Non-Medical): No  Physical Activity: Inactive (09/11/2022)   Exercise Vital Sign    Days of Exercise per Week: 0 days    Minutes of Exercise per Session: 0 min  Stress: No Stress Concern Present (09/11/2022)   [employer] of Occupational Health - Occupational Stress Questionnaire    Feeling of Stress : Not at all  Social Connections: Socially Integrated (09/11/2022)   Social Connection and Isolation Panel [NHANES]    Frequency of Communication with Friends and Family: More than three times a week    Frequency of Social Gatherings with Friends and Family: More than three times a week    Attends Religious Services: More than 4 times per year    Active Member of [employer] or Organizations: Yes    Attends Banker Meetings: More than 4 times per year    Marital Status: Married  Catering manager Violence: Not At Risk (09/11/2022)   Humiliation, Afraid, Rape, and Kick questionnaire    Fear of Current or Ex-Partner: No    Emotionally  Abused: No    Physically Abused: No    Sexually Abused: No    Environmental and Social history  Lives in a house with a dry environment, cat located inside the household, carpet in the bedroom, no plastic on the bed, no plastic on the pillow, no smoking ongoing with inside the household.  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  Objective:   Vitals:   09/30/22 1424  BP: 128/80  Pulse: 79  Resp:  18  SpO2: 97%   Height: 4\' 11"  (149.9 cm) Weight: 129 lb 3.2 oz (58.6 kg)  Physical Exam Constitutional:      Appearance: She is not diaphoretic.  HENT:     Head: Normocephalic.     Right Ear: Tympanic membrane, ear canal and external ear normal.     Left Ear: Tympanic membrane, ear canal and external ear normal.     Nose: Nose normal. No mucosal edema or rhinorrhea.     Mouth/Throat:     Pharynx: Uvula midline. No oropharyngeal exudate.  Eyes:     Conjunctiva/sclera: Conjunctivae normal.  Neck:     Thyroid: No thyromegaly.     Trachea: Trachea normal. No tracheal tenderness or tracheal deviation.  Cardiovascular:     Rate and Rhythm: Normal rate and regular rhythm.     Heart sounds: Normal heart sounds, S1 normal and S2 normal. No murmur heard. Pulmonary:     Effort: No respiratory distress.     Breath sounds: Normal breath sounds. No stridor. No wheezing or rales.  Lymphadenopathy:     Head:     Right side of head: No tonsillar adenopathy.     Left side of head: No tonsillar adenopathy.     Cervical: No cervical adenopathy.  Skin:    Findings: No erythema or rash.     Nails: There is no clubbing.  Neurological:     Mental Status: She is alert.     Diagnostics: Allergy skin tests were performed.  She did not demonstrate any hypersensitivity against tree nuts or peanut.  Assessment and Plan:    1. Adverse food reaction, initial encounter    1.  Allergen avoidance measures???  2.  EpiPen, Benadryl, MD/ER evaluation for allergic reaction  3.   Blood -nut panel with reflex  4.  Further evaluation???  Netra appeared to have a 1 to 2-week period of immunological hyperreactivity manifested as recurrent global urticaria around Thanksgiving 2023.  There does appear to be a loose temporal relationship be seen consumption of tree nuts and peanuts and the development of this issue but there is not really a very firm temporal relationship and her skin test today did not identify any hypersensitivity against peanuts or tree nuts.  Will follow-up this analysis with a IgE nut panel and then make a decision about possibly challenging her to these food products in the future.  I will contact her with the results of her blood tests once they are available for review.  12-06-1977, MD Allergy / Immunology [facility]

## 2022-10-01 ENCOUNTER — Encounter: Payer: Self-pay | Admitting: Allergy and Immunology

## 2022-10-03 LAB — PEANUT COMPONENTS
F352-IgE Ara h 8: <0.1 kU/L
F422-IgE Ara h 1: <0.1 kU/L
F423-IgE Ara h 2: <0.1 kU/L
F424-IgE Ara h 3: <0.1 kU/L
F427-IgE Ara h 9: <0.1 kU/L
F447-IgE Ara h 6: <0.1 kU/L

## 2022-10-03 LAB — IGE NUT PROF. W/COMPONENT RFLX
F017-IgE Hazelnut (Filbert): <0.1 kU/L
F018-IgE Brazil Nut: <0.1 kU/L
F020-IgE Almond: 0.12 kU/L — AB
F202-IgE Cashew Nut: <0.1 kU/L
F203-IgE Pistachio Nut: 0.16 kU/L — AB
F256-IgE Walnut: <0.1 kU/L
Macadamia Nut, IgE: 0.14 kU/L — AB
Peanut, IgE: 0.17 kU/L — AB
Pecan Nut IgE: <0.1 kU/L

## 2022-10-03 LAB — ALLERGEN COMPONENT COMMENTS

## 2022-10-27 ENCOUNTER — Other Ambulatory Visit: Payer: Self-pay | Admitting: Internal Medicine

## 2022-10-27 DIAGNOSIS — J302 Other seasonal allergic rhinitis: Secondary | ICD-10-CM

## 2022-11-11 DIAGNOSIS — L821 Other seborrheic keratosis: Secondary | ICD-10-CM | POA: Diagnosis not present

## 2022-11-11 DIAGNOSIS — L578 Other skin changes due to chronic exposure to nonionizing radiation: Secondary | ICD-10-CM | POA: Diagnosis not present

## 2022-11-24 ENCOUNTER — Other Ambulatory Visit: Payer: Self-pay | Admitting: Internal Medicine

## 2022-11-24 ENCOUNTER — Encounter: Payer: Self-pay | Admitting: Internal Medicine

## 2022-11-24 DIAGNOSIS — I10 Essential (primary) hypertension: Secondary | ICD-10-CM

## 2022-11-24 MED ORDER — OLMESARTAN MEDOXOMIL 20 MG PO TABS: 20.0000 mg | ORAL_TABLET | Freq: Every day | ORAL | 1 refills | Status: DC

## 2022-11-25 ENCOUNTER — Other Ambulatory Visit: Payer: Self-pay | Admitting: Internal Medicine

## 2022-12-15 ENCOUNTER — Encounter: Payer: Self-pay | Admitting: Internal Medicine

## 2022-12-22 ENCOUNTER — Encounter: Payer: Self-pay | Admitting: Internal Medicine

## 2022-12-22 ENCOUNTER — Ambulatory Visit (INDEPENDENT_AMBULATORY_CARE_PROVIDER_SITE_OTHER): Payer: Medicare PPO

## 2022-12-22 ENCOUNTER — Ambulatory Visit: Payer: Medicare PPO | Admitting: Internal Medicine

## 2022-12-22 VITALS — BP 130/70 | HR 76 | Temp 98.0°F | Ht 59.0 in | Wt 128.0 lb

## 2022-12-22 DIAGNOSIS — H903 Sensorineural hearing loss, bilateral: Secondary | ICD-10-CM

## 2022-12-22 DIAGNOSIS — R0602 Shortness of breath: Secondary | ICD-10-CM | POA: Diagnosis not present

## 2022-12-22 DIAGNOSIS — R052 Subacute cough: Secondary | ICD-10-CM | POA: Diagnosis not present

## 2022-12-22 DIAGNOSIS — K920 Hematemesis: Secondary | ICD-10-CM | POA: Insufficient documentation

## 2022-12-22 DIAGNOSIS — I10 Essential (primary) hypertension: Secondary | ICD-10-CM | POA: Diagnosis not present

## 2022-12-22 DIAGNOSIS — M545 Low back pain, unspecified: Secondary | ICD-10-CM | POA: Insufficient documentation

## 2022-12-22 DIAGNOSIS — R413 Other amnesia: Secondary | ICD-10-CM

## 2022-12-22 DIAGNOSIS — K2101 Gastro-esophageal reflux disease with esophagitis, with bleeding: Secondary | ICD-10-CM | POA: Diagnosis not present

## 2022-12-22 DIAGNOSIS — R059 Cough, unspecified: Secondary | ICD-10-CM | POA: Diagnosis not present

## 2022-12-22 LAB — CBC WITH DIFFERENTIAL/PLATELET
Basophils Absolute: 0.1 10*3/uL (ref 0.0–0.1)
Basophils Relative: 0.9 % (ref 0.0–3.0)
Eosinophils Absolute: 0.2 10*3/uL (ref 0.0–0.7)
Eosinophils Relative: 2.2 % (ref 0.0–5.0)
HCT: 38.7 % (ref 36.0–46.0)
Hemoglobin: 13.3 g/dL (ref 12.0–15.0)
Lymphocytes Relative: 24.3 % (ref 12.0–46.0)
Lymphs Abs: 2 10*3/uL (ref 0.7–4.0)
MCHC: 34.2 g/dL (ref 30.0–36.0)
MCV: 91.6 fl (ref 78.0–100.0)
Monocytes Absolute: 0.7 10*3/uL (ref 0.1–1.0)
Monocytes Relative: 8.9 % (ref 3.0–12.0)
Neutro Abs: 5.2 10*3/uL (ref 1.4–7.7)
Neutrophils Relative %: 63.7 % (ref 43.0–77.0)
Platelets: 200 10*3/uL (ref 150.0–400.0)
RBC: 4.23 Mil/uL (ref 3.87–5.11)
RDW: 13.3 % (ref 11.5–15.5)
WBC: 8.2 10*3/uL (ref 4.0–10.5)

## 2022-12-22 LAB — BASIC METABOLIC PANEL WITH GFR
BUN: 21 mg/dL (ref 6–23)
CO2: 30 meq/L (ref 19–32)
Calcium: 9.4 mg/dL (ref 8.4–10.5)
Chloride: 104 meq/L (ref 96–112)
Creatinine, Ser: 0.93 mg/dL (ref 0.40–1.20)
GFR: 64.3 mL/min
Glucose, Bld: 101 mg/dL — ABNORMAL HIGH (ref 70–99)
Potassium: 3.9 meq/L (ref 3.5–5.1)
Sodium: 140 meq/L (ref 135–145)

## 2022-12-22 LAB — PROTIME-INR
INR: 1 ratio (ref 0.8–1.0)
Prothrombin Time: 10.5 s (ref 9.6–13.1)

## 2022-12-22 LAB — APTT: aPTT: 28.8 s (ref 25.4–36.8)

## 2022-12-22 MED ORDER — ESOMEPRAZOLE MAGNESIUM 40 MG PO CPDR
40.0000 mg | DELAYED_RELEASE_CAPSULE | Freq: Every day | ORAL | 1 refills | Status: DC
Start: 2022-12-22 — End: 2023-03-18

## 2022-12-22 MED ORDER — TRAMADOL HCL 50 MG PO TABS
50.0000 mg | ORAL_TABLET | Freq: Four times a day (QID) | ORAL | 0 refills | Status: AC | PRN
Start: 2022-12-22 — End: 2022-12-29

## 2022-12-22 NOTE — Patient Instructions (Signed)
Hematemesis ?Hematemesis is when you vomit blood. It is a sign of bleeding in the upper GI tract (gastrointestinal tract). The upper GI tract includes the mouth, throat, esophagus, stomach, and the upper part of the small intestine (duodenum). Hematemesis is usually caused by bleeding in the esophagus or stomach. ?You may suddenly vomit bright red blood. Or the blood may look like coffee grounds. You may also have other symptoms, such as: ?Stomach pain. ?Heartburn. ?Stool (feces) that looks black and tarry. ?Follow these instructions at home: ? ?Take over-the-counter and prescription medicines only as told by your health care provider. ?Do not take NSAIDs, including aspirin and ibuprofen, unless your health care provider approves. These medicines can increase bleeding. ?Rest as needed. ?Drink enough fluids to keep your urine pale yellow. Take small sips of fluid at a time. ?Do not drink alcohol. ?Do not use any products that contain nicotine or tobacco. These products include cigarettes, chewing tobacco, and vaping devices, such as e-cigarettes. If you need help quitting, ask your health care provider. ?Keep all follow-up visits. This is important. ?Contact a health care provider if: ?You have more blood in your vomit. ?Your vomiting of blood begins again after it has stopped. ?You have persistent stomach pain. ?You have nausea, indigestion, or heartburn. ?Get help right away if: ?You faint. ?You feel weak or dizzy. ?You are urinating less than normal or not at all. ?You vomit up: ?Large amounts of blood or dark material that may look like coffee grounds. ?Bright red blood. ?You have any of the following: ?Persistent vomiting. ?A rapid heartbeat. ?Blood in your stool. ?Chest pain. ?Difficulty breathing. ?These symptoms may be an emergency. Get help right away. Call 911. ?Do not wait to see if the symptoms will go away. ?Do not drive yourself to the hospital. ?Summary ?Hematemesis is when you vomit blood. It is a  sign of bleeding in the upper GI tract (gastrointestinal tract). ?Hematemesis is usually caused by bleeding in the esophagus or stomach. ?Do not take NSAIDs (including aspirin and ibuprofen), drink alcohol, or use tobacco products. ?Take over-the-counter and prescription medicines only as told by your health care provider. ?This information is not intended to replace advice given to you by your health care provider. Make sure you discuss any questions you have with your health care provider. ?Document Revised: 04/17/2021 Document Reviewed: 04/17/2021 ?Elsevier Patient Education ? 2023 Elsevier Inc. ? ?

## 2022-12-22 NOTE — Progress Notes (Unsigned)
Subjective:  Patient ID: Beth Aguilar, female    DOB: [birth year]  Age: 67 y.o. MRN: [id number]  CC: Cough, Hypertension, Gastroesophageal Reflux, and Back Pain   HPI Beth Aguilar presents for f/up ---  She continues to complain of low back pain and tells me she is taking the combination of Aleve and Advil.  The back pain is nonradiating and she denies lower extremity paresthesias.  She complains of a 31-month history of nonproductive cough.  She is bringing up phlegm but she thinks it is coming from her upper esophagus.  She says the phlegm is black and red.  She denies odynophagia, dysphagia, abdominal pain, nausea, vomiting, or melena.  Outpatient Medications Prior to Visit  Medication Sig Dispense Refill   aspirin 81 MG tablet Take 1 tablet (81 mg total) by mouth daily. 100 tablet 99   azelastine (ASTELIN) 0.1 % nasal spray Place 2 sprays into both nostrils 2 (two) times daily as needed for rhinitis. Annual appt due in March must see provider for future refills 30 mL 0   EPINEPHrine 0.3 mg/0.3 mL IJ SOAJ injection Inject 0.3 mg into the muscle as needed for anaphylaxis. 1 each 1   rosuvastatin (CRESTOR) 10 MG tablet Take 1 tablet (10 mg total) by mouth daily. 90 tablet 3   triamcinolone cream (KENALOG) 0.1 % Apply 1 application. topically 2 (two) times daily. 30 g 0   ofloxacin (FLOXIN) 0.3 % OTIC solution Place 10 drops into the left ear daily. 5 mL 0   olmesartan (BENICAR) 20 MG tablet Take 1 tablet (20 mg total) by mouth daily. 90 tablet 1   No facility-administered medications prior to visit.    ROS Review of Systems  Constitutional:  Negative for chills, diaphoresis and fatigue.  HENT:  Positive for hearing loss. Negative for nosebleeds, sore throat, trouble swallowing and voice change.   Respiratory:  Positive for cough. Negative for chest tightness, shortness of breath and wheezing.   Cardiovascular:  Negative for chest pain, palpitations and leg swelling.   Gastrointestinal: Negative.  Negative for abdominal pain, diarrhea, nausea and vomiting.  Genitourinary: Negative.   Musculoskeletal:  Positive for back pain. Negative for arthralgias and myalgias.  Skin: Negative.   Neurological: Negative.  Negative for dizziness, weakness, light-headedness and headaches.  Hematological:  Negative for adenopathy. Does not bruise/bleed easily.  Psychiatric/Behavioral:  Positive for decreased concentration. Negative for suicidal ideas. The patient is not nervous/anxious.     Objective:  BP 130/70 (BP Location: Right Arm, Patient Position: Sitting, Cuff Size: Normal)   Pulse 76   Temp 98 F (36.7 C) (Oral)   Ht 4\' 11"  (1.499 m)   Wt 128 lb (58.1 kg)   SpO2 96%   BMI 25.85 kg/m   BP Readings from Last 3 Encounters:  12/22/22 130/70  09/30/22 128/80  09/24/22 118/74    Wt Readings from Last 3 Encounters:  12/22/22 128 lb (58.1 kg)  09/30/22 129 lb 3.2 oz (58.6 kg)  09/24/22 129 lb (58.5 kg)    Physical Exam Vitals reviewed. Exam conducted with a chaperone present (Shirron).  Constitutional:      Appearance: She is not ill-appearing.  HENT:     Nose: Nose normal.     Mouth/Throat:     Mouth: Mucous membranes are moist.  Eyes:     General: No scleral icterus.    Conjunctiva/sclera: Conjunctivae normal.  Cardiovascular:     Rate and Rhythm: Normal rate and regular rhythm.     Pulses:  Normal pulses.     Heart sounds: No murmur heard.    No friction rub. No gallop.  Pulmonary:     Effort: Pulmonary effort is normal.     Breath sounds: No stridor. No wheezing, rhonchi or rales.  Abdominal:     General: Abdomen is flat.     Palpations: There is no mass.     Tenderness: There is no abdominal tenderness. There is no guarding or rebound.     Hernia: No hernia is present.  Genitourinary:    Rectum: Guaiac result positive. No mass, tenderness, anal fissure, external hemorrhoid or internal hemorrhoid. Normal anal tone.  Musculoskeletal:         General: Normal range of motion.     Cervical back: Neck supple.     Thoracic back: Normal.     Lumbar back: Normal. No bony tenderness. Normal range of motion. Negative right straight leg raise test and negative left straight leg raise test.  Lymphadenopathy:     Cervical: No cervical adenopathy.  Skin:    General: Skin is warm and dry.  Neurological:     General: No focal deficit present.     Mental Status: She is alert. Mental status is at baseline.     Cranial Nerves: Cranial nerves 2-12 are intact.     Motor: Motor function is intact.     Coordination: Coordination is intact.     Gait: Gait is intact.     Deep Tendon Reflexes: Reflexes normal.     Reflex Scores:      Tricep reflexes are 0 on the right side and 0 on the left side.      Bicep reflexes are 1+ on the right side and 1+ on the left side.      Brachioradialis reflexes are 1+ on the right side and 1+ on the left side.      Patellar reflexes are 1+ on the right side and 1+ on the left side.      Achilles reflexes are 0 on the right side and 0 on the left side.    Lab Results  Component Value Date   WBC 8.2 12/22/2022   HGB 13.3 12/22/2022   HCT 38.7 12/22/2022   PLT 200.0 12/22/2022   GLUCOSE 101 (H) 12/22/2022   CHOL 170 09/01/2022   TRIG 331.0 (H) 09/01/2022   HDL 44.00 09/01/2022   LDLDIRECT 99.0 09/01/2022   LDLCALC 98 05/14/2021   ALT 16 09/01/2022   AST 18 09/01/2022   NA 140 12/22/2022   K 3.9 12/22/2022   CL 104 12/22/2022   CREATININE 0.93 12/22/2022   BUN 21 12/22/2022   CO2 30 12/22/2022   TSH 1.69 09/01/2022   INR 1.0 12/22/2022   HGBA1C 5.6 10/03/2020    MR Brain Wo Contrast  Result Date: 08/30/2021 CLINICAL DATA:  Provided history: Memory loss. Additional history provided by scanning technologist: [facility] loss for 1 year, confusion. EXAM: MRI HEAD WITHOUT CONTRAST TECHNIQUE: Multiplanar, multiecho pulse sequences of the brain and surrounding structures were obtained without intravenous  contrast. COMPARISON:  Cervical spine MRI 12/14/2020. FINDINGS: Brain: Cerebral volume is normal for age. Mild multifocal T2 FLAIR hyperintense signal abnormality within the cerebral white matter, nonspecific but most often secondary to chronic small vessel ischemia. No cortical encephalomalacia is identified. There is no acute infarct. No evidence of an intracranial mass. No chronic intracranial blood products. No extra-axial fluid collection. No midline shift. Vascular: Maintained flow voids within the proximal large arterial vessels.  Skull and upper cervical spine: No focal suspicious marrow lesion. Incompletely assessed cervical spondylosis. Sinuses/Orbits: Visualized orbits show no acute finding. Trace mucosal thickening within the bilateral ethmoid sinuses. IMPRESSION: No evidence of acute intracranial abnormality. Mild multifocal T2 FLAIR hyperintense signal abnormality within the cerebral white matter, nonspecific but most often secondary to chronic small vessel ischemia. Otherwise unremarkable non-contrast MRI appearance of the brain for age. Electronically Signed   By: Kellie Simmering D.O.   On: 08/30/2021 07:54   DG Chest 2 View  Result Date: 12/22/2022 CLINICAL DATA:  Cough for 3 months.  Shortness of breath. EXAM: CHEST - 2 VIEW COMPARISON:  Chest radiographs 05/14/2021 and 05/08/2015 FINDINGS: Cardiac silhouette and mediastinal contours are within normal limits. The lungs are clear. No pleural effusion pneumothorax. Minimal multilevel degenerative disc changes of the thoracic spine. IMPRESSION: No active cardiopulmonary disease. Electronically Signed   By: Yvonne Kendall M.D.   On: 12/22/2022 14:33     Assessment & Plan:   Hematemesis without nausea- She agrees to stop taking NSAIDs.  Labs are reassuring.  Will start a PPI. -     CBC with Differential/Platelet; Future -     Ambulatory referral to Gastroenterology -     Protime-INR; Future -     APTT; Future  Acute bilateral low back pain  without sciatica -     traMADol HCl; Take 1 tablet (50 mg total) by mouth every 6 (six) hours as needed for up to 7 days.  Dispense: 25 tablet; Refill: 0  Primary hypertension- Her BP is well controlled. -     Basic metabolic panel; Future -     CBC with Differential/Platelet; Future  Sensorineural hearing loss (SNHL) of both ears -     Ambulatory referral to Audiology  Subacute cough- Her chest x-ray is negative for mass or infiltrate. -     DG Chest 2 View; Future  Gastroesophageal reflux disease with esophagitis and hemorrhage -     Esomeprazole Magnesium; Take 1 capsule (40 mg total) by mouth daily.  Dispense: 90 capsule; Refill: 1  Memory loss -     Ambulatory referral to Neurology     Follow-up: Return in about 3 months (around 03/23/2023).  Scarlette Calico, MD

## 2022-12-23 ENCOUNTER — Encounter: Payer: Self-pay | Admitting: Nurse Practitioner

## 2022-12-29 ENCOUNTER — Ambulatory Visit: Payer: Medicare PPO | Attending: Audiologist | Admitting: Audiologist

## 2022-12-29 DIAGNOSIS — H903 Sensorineural hearing loss, bilateral: Secondary | ICD-10-CM | POA: Diagnosis not present

## 2022-12-29 NOTE — Procedures (Signed)
  Outpatient Audiology and [facility] [address] [place], [place]  [address] [phone]  AUDIOLOGICAL  EVALUATION  NAME: Beth Aguilar     DOB:   21-Feb-1956      MRN: [id number]                                                                                     DATE: 12/29/2022     REFERENT: Etta Grandchild, MD STATUS: Outpatient DIAGNOSIS: Sensorineural Hearing Loss Bilateral     History: [name] was seen for an audiological evaluation. [name] was accompanied to the appointment by her husband.  [name] is receiving a hearing evaluation due to concerns for hearing loss. 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 [name] has difficulty hearing in noisy places and when people do not enunciate. This difficulty began gradually. No pain or pressure reported in either ear. Tinnitus denied for both ears. 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  Medical history negative for a condition which is a risk factor for hearing loss. No other relevant case history reported.   Evaluation:  Otoscopy showed a clear view of the tympanic membranes, bilaterally Tympanometry results were consistent with normal middle ear function. Bi  Audiometric testing was completed using conventional audiometry with 35 transducer. Speech Recognition Thresholds were 35 dB in the right ear and 35 dB in the left ear. Word Recognition was performed 40 dB SL, scored 100% in the right ear and 100% in the left ear. Pure tone thresholds show normal sloping to mils sensorineural hearing loss in each ear.   Results:  The test results were reviewed with [name]. She has a mild sensorineural hearing loss bilaterally. 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 [name] was given a list of local providers and a copy of her audiogram. [name] was counseled on hearing aids and her hearing loss.    Recommendations: Amplification is necessary for both ears.  Hearing aids can be purchased from a variety of locations. See provided list for locations in the [place] area.    35 minutes spent testing and counseling on results.   [name]  Audiologist, Au.D., CCC-A 12/29/2022  3:38 PM  Cc: Etta Grandchild, MD

## 2023-01-20 DIAGNOSIS — M47816 Spondylosis without myelopathy or radiculopathy, lumbar region: Secondary | ICD-10-CM | POA: Diagnosis not present

## 2023-01-20 DIAGNOSIS — M47896 Other spondylosis, lumbar region: Secondary | ICD-10-CM | POA: Diagnosis not present

## 2023-01-21 DIAGNOSIS — M50322 Other cervical disc degeneration at C5-C6 level: Secondary | ICD-10-CM | POA: Diagnosis not present

## 2023-01-21 DIAGNOSIS — M9905 Segmental and somatic dysfunction of pelvic region: Secondary | ICD-10-CM | POA: Diagnosis not present

## 2023-01-21 DIAGNOSIS — M25552 Pain in left hip: Secondary | ICD-10-CM | POA: Diagnosis not present

## 2023-01-21 DIAGNOSIS — M9901 Segmental and somatic dysfunction of cervical region: Secondary | ICD-10-CM | POA: Diagnosis not present

## 2023-01-26 DIAGNOSIS — M50322 Other cervical disc degeneration at C5-C6 level: Secondary | ICD-10-CM | POA: Diagnosis not present

## 2023-01-26 DIAGNOSIS — M9905 Segmental and somatic dysfunction of pelvic region: Secondary | ICD-10-CM | POA: Diagnosis not present

## 2023-01-26 DIAGNOSIS — M9901 Segmental and somatic dysfunction of cervical region: Secondary | ICD-10-CM | POA: Diagnosis not present

## 2023-01-26 DIAGNOSIS — M25552 Pain in left hip: Secondary | ICD-10-CM | POA: Diagnosis not present

## 2023-01-27 DIAGNOSIS — M50322 Other cervical disc degeneration at C5-C6 level: Secondary | ICD-10-CM | POA: Diagnosis not present

## 2023-01-27 DIAGNOSIS — M9905 Segmental and somatic dysfunction of pelvic region: Secondary | ICD-10-CM | POA: Diagnosis not present

## 2023-01-27 DIAGNOSIS — M9901 Segmental and somatic dysfunction of cervical region: Secondary | ICD-10-CM | POA: Diagnosis not present

## 2023-01-27 DIAGNOSIS — M25552 Pain in left hip: Secondary | ICD-10-CM | POA: Diagnosis not present

## 2023-01-29 DIAGNOSIS — M50322 Other cervical disc degeneration at C5-C6 level: Secondary | ICD-10-CM | POA: Diagnosis not present

## 2023-01-29 DIAGNOSIS — M9905 Segmental and somatic dysfunction of pelvic region: Secondary | ICD-10-CM | POA: Diagnosis not present

## 2023-01-29 DIAGNOSIS — M25552 Pain in left hip: Secondary | ICD-10-CM | POA: Diagnosis not present

## 2023-01-29 DIAGNOSIS — M9901 Segmental and somatic dysfunction of cervical region: Secondary | ICD-10-CM | POA: Diagnosis not present

## 2023-02-02 DIAGNOSIS — M9901 Segmental and somatic dysfunction of cervical region: Secondary | ICD-10-CM | POA: Diagnosis not present

## 2023-02-02 DIAGNOSIS — M9905 Segmental and somatic dysfunction of pelvic region: Secondary | ICD-10-CM | POA: Diagnosis not present

## 2023-02-02 DIAGNOSIS — M50322 Other cervical disc degeneration at C5-C6 level: Secondary | ICD-10-CM | POA: Diagnosis not present

## 2023-02-02 DIAGNOSIS — M25552 Pain in left hip: Secondary | ICD-10-CM | POA: Diagnosis not present

## 2023-02-03 DIAGNOSIS — L578 Other skin changes due to chronic exposure to nonionizing radiation: Secondary | ICD-10-CM | POA: Diagnosis not present

## 2023-02-03 DIAGNOSIS — L821 Other seborrheic keratosis: Secondary | ICD-10-CM | POA: Diagnosis not present

## 2023-02-03 DIAGNOSIS — L814 Other melanin hyperpigmentation: Secondary | ICD-10-CM | POA: Diagnosis not present

## 2023-02-04 ENCOUNTER — Ambulatory Visit: Payer: Medicare PPO | Admitting: Nurse Practitioner

## 2023-02-04 ENCOUNTER — Encounter: Payer: Self-pay | Admitting: Nurse Practitioner

## 2023-02-04 VITALS — BP 164/90 | HR 76 | Ht 59.25 in | Wt 127.5 lb

## 2023-02-04 DIAGNOSIS — R042 Hemoptysis: Secondary | ICD-10-CM | POA: Diagnosis not present

## 2023-02-04 DIAGNOSIS — R059 Cough, unspecified: Secondary | ICD-10-CM | POA: Diagnosis not present

## 2023-02-04 NOTE — Progress Notes (Unsigned)
.    02/04/2023 Beth Aguilar [id number] [birth year]   CHIEF COMPLAINT: Coughing up brown phlegm with blood   HISTORY OF PRESENT ILLNESS: Beth Aguilar is a 67 year old female with a past medical history of arthritis, hypertension, hypercholesterolemia, kidney stones, anal fissure with further rectal involvement which required surgery 1992. Past C section. She presents to our office today as referred by Dr. Sanda Linger for further evaluation regarding suspected hematemesis. She denies having any heartburn or dysphagia. She has a chronic productive cough and post nasal drainage. In the past, her drainage she coughed up was clear, yellow or green. However, for the past 3 months she describes coughing up " thick stiff brown phlegm with some blood, not bright red and not black". She is confident that she coughs up and is not spitting up or vomiting brown/red phlegm/sputum. Over the last 2 weeks, she is seeing less blood in her phlegm. She has noticed a change in her voice, sounds "croaky".  She is at risk for PUD secondary to NSAID use. She has back pain an previously took Aleve one tab bid with 3 Advil at night for 2 to 3 months then switched to Meloxicam x 2 weeks and one week ago switched to Diclofenac 75mg  bid. She takes ASA 81mg  daily. She started Esomeprazole 40mg  once daily as prescribed by Dr. Yetta Barre about 3 weeks ago. Chest xray was negative. She denies having any SOB or CP. She uses Astein nasal spray for PND/allergies. 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 No history of pulmonary or cardiac disease. Nonsmoker. Never had an EGD. She reported undergoing several colonoscopies in her life time, no polyps. Her most recent colonoscopy was 10 or 12 years ago. She underwent a cologuard test 09/10/2022 which was negative.       Latest Ref Rng & Units 12/22/2022    2:23 PM 10/03/2020    8:58 AM 05/04/2020   11:33 AM  CBC  WBC 4.0 - 10.5 K/uL 8.2  6.6  8.5   Hemoglobin 12.0 - 15.0 g/dL 40.9  81.1  91.4    Hematocrit 36.0 - 46.0 % 38.7  42.3  41.5   Platelets 150.0 - 400.0 K/uL 200.0  182.0  202        Latest Ref Rng & Units 12/22/2022    2:23 PM 09/01/2022    2:39 PM 10/03/2020    8:58 AM  CMP  Glucose 70 - 99 mg/dL 782  956  87   BUN 6 - 23 mg/dL 21  22  18    Creatinine 0.40 - 1.20 mg/dL 2.13  0.86  5.78   Sodium 135 - 145 mEq/L 140  138  137   Potassium 3.5 - 5.1 mEq/L 3.9  4.3  4.7   Chloride 96 - 112 mEq/L 104  103  102   CO2 19 - 32 mEq/L 30  30  31    Calcium 8.4 - 10.5 mg/dL 9.4  9.3  46.9   Total Protein 6.0 - 8.3 g/dL  7.2  7.4   Total Bilirubin 0.2 - 1.2 mg/dL  0.3  0.6   Alkaline Phos 39 - 117 U/L  56  57   AST 0 - 37 U/L  18  23   ALT 0 - 35 U/L  16  24      Past Medical History:  Diagnosis Date   Anal fissure    Arthritis    HLD (hyperlipidemia)    HTN (hypertension)    Kidney stone  Urticaria    Past Surgical History:  Procedure Laterality Date   CESAREAN SECTION     x2   Colorectal Surgery     x 2, 1982 and 1991   ROTATOR CUFF REPAIR Left    TONSILLECTOMY AND ADENOIDECTOMY  1965   Social History: She is married. Tourist information centre manager. She has one son and one daughter. Nonsmoker. No alcohol use. No drug use.   Family History: family history includes Arthritis in her mother; Depression in her mother; Heart disease in her mother; Hyperlipidemia in her mother; Hypertension in her mother; Pancreatic cancer in her cousin and maternal uncle; Stroke in her father. No known family history of esophageal, gastric or colon cancer.   Allergies  Allergen Reactions   Flonase [Fluticasone Propionate] Other (See Comments)    hearing   Compazine [Prochlorperazine]    Gadolinium Derivatives    Guaifenesin & Derivatives     Felt drunk   Macadamia Nut Oil     Also peanuts and almonds   Pistachio Nut (Diagnostic)    Sulfa Antibiotics       Outpatient Encounter Medications as of 02/04/2023  Medication Sig   acetaminophen (TYLENOL) 325 MG tablet Take 650 mg by  mouth at bedtime.   aspirin 81 MG tablet Take 1 tablet (81 mg total) by mouth daily.   azelastine (ASTELIN) 0.1 % nasal spray Place 2 sprays into both nostrils 2 (two) times daily as needed for rhinitis. Annual appt due in March must see provider for future refills   diclofenac (VOLTAREN) 75 MG EC tablet Take 75 mg by mouth 2 (two) times daily.   esomeprazole (NEXIUM) 40 MG capsule Take 1 capsule (40 mg total) by mouth daily.   rosuvastatin (CRESTOR) 10 MG tablet Take 1 tablet (10 mg total) by mouth daily.   triamcinolone cream (KENALOG) 0.1 % Apply 1 application. topically 2 (two) times daily. (Patient taking differently: Apply 1 application  topically as needed.)   EPINEPHrine 0.3 mg/0.3 mL IJ SOAJ injection Inject 0.3 mg into the muscle as needed for anaphylaxis. (Patient not taking: Reported on 02/04/2023)   No facility-administered encounter medications on file as of 02/04/2023.    REVIEW OF SYSTEMS:  Gen: Denies fever, sweats or chills. No weight loss.  CV: Denies chest pain, palpitations or edema. Resp: See HPI.  GI: See HPI. GU : Denies urinary burning, blood in urine, increased urinary frequency or incontinence. MS: Denies joint pain, muscles aches or weakness. Derm: Denies rash, itchiness, skin lesions or unhealing ulcers. Psych: Denies depression, anxiety, memory loss or confusion. Heme: Denies bruising, easy bleeding. Neuro:  Denies headaches, dizziness or paresthesias. Endo:  Denies any problems with DM, thyroid or adrenal function.  PHYSICAL EXAM: BP (!) 164/90 (BP Location: Left Arm, Patient Position: Sitting, Cuff Size: Normal)   Pulse 76   Ht 4' 11.25" (1.505 m) Comment: height measured without shoes  Wt 127 lb 8 oz (57.8 kg)   BMI 25.54 kg/m  General: 67 year old female in no acute distress. Head: Normocephalic and atraumatic. Eyes:  Sclerae non-icteric, conjunctive pink. Ears: Normal auditory acuity. Mouth: Dentition intact. No ulcers or lesions.  Neck: Supple,  no lymphadenopathy or thyromegaly.  Lungs: Clear bilaterally to auscultation without wheezes, crackles or rhonchi. Heart: Regular rate and rhythm. No murmur, rub or gallop appreciated.  Abdomen: Soft, nontender, nondistended. No masses. No hepatosplenomegaly. Normoactive bowel sounds x 4 quadrants.  Rectal: Deferred.  Musculoskeletal: Symmetrical with no gross deformities. Skin: Warm and dry. No rash  or lesions on visible extremities. Extremities: No edema. Neurological: Alert oriented x 4, no focal deficits.  Psychological:  Alert and cooperative. Normal mood and affect.  ASSESSMENT AND PLAN:  67 year old female with brown phlegm/sputum with a component of blood x 3 months. Patient is confident that she is coughing up this matter and not vomiting it up. Positive voice changes. Nonsmoker. Chronic NSAID use. -I advised the patient to contact Dr. Yetta Barre to discuss scheduling a chest CT -Pulmonary referral -Consider ENT evaluation if chest CT and pulmonary evaluation unrevealing  -Future EGD to rule out esophagitis/mucositis/PUD or other GI abnormality to explain her symptoms. She is at risk for PUD in setting of chronic NSAID use. Further discuss scheduling EGD after pulmonary evaluation completed  -Patient to go to the ED if she develops CP or SOB -Continue Esomeprazole 40mg  QD  Colon cancer screening, up to date. Negative Cologuard 08/2022. Patient reported undergoing a normal colonoscopy 10 to 12 years ago.       CC:  Etta Grandchild, MD

## 2023-02-04 NOTE — Patient Instructions (Addendum)
We have sent over a referral to Pulmonology.  Follow up with Dr.Jones to further discuss a chest CT.   Coontinue Esomeprazole daily.  Due to recent changes in healthcare laws, you may see the results of your imaging and laboratory studies on MyChart before your provider has had a chance to review them.  We understand that in some cases there may be results that are confusing or concerning to you. Not all laboratory results come back in the same time frame and the provider may be waiting for multiple results in order to interpret others.  Please give [place] 48 hours in order for your provider to thoroughly review all the results before contacting the office for clarification of your results.    Thank you for trusting me with your gastrointestinal care!   Alcide Evener, CRNP

## 2023-02-09 DIAGNOSIS — M9905 Segmental and somatic dysfunction of pelvic region: Secondary | ICD-10-CM | POA: Diagnosis not present

## 2023-02-09 DIAGNOSIS — M50322 Other cervical disc degeneration at C5-C6 level: Secondary | ICD-10-CM | POA: Diagnosis not present

## 2023-02-09 DIAGNOSIS — M9901 Segmental and somatic dysfunction of cervical region: Secondary | ICD-10-CM | POA: Diagnosis not present

## 2023-02-09 DIAGNOSIS — M25552 Pain in left hip: Secondary | ICD-10-CM | POA: Diagnosis not present

## 2023-02-11 ENCOUNTER — Other Ambulatory Visit: Payer: Self-pay | Admitting: Internal Medicine

## 2023-02-16 ENCOUNTER — Encounter: Payer: Self-pay | Admitting: Internal Medicine

## 2023-02-17 ENCOUNTER — Other Ambulatory Visit: Payer: Self-pay | Admitting: Internal Medicine

## 2023-02-17 DIAGNOSIS — M5451 Vertebrogenic low back pain: Secondary | ICD-10-CM | POA: Diagnosis not present

## 2023-02-17 DIAGNOSIS — R042 Hemoptysis: Secondary | ICD-10-CM

## 2023-02-18 DIAGNOSIS — M50322 Other cervical disc degeneration at C5-C6 level: Secondary | ICD-10-CM | POA: Diagnosis not present

## 2023-02-18 DIAGNOSIS — M9905 Segmental and somatic dysfunction of pelvic region: Secondary | ICD-10-CM | POA: Diagnosis not present

## 2023-02-18 DIAGNOSIS — M9901 Segmental and somatic dysfunction of cervical region: Secondary | ICD-10-CM | POA: Diagnosis not present

## 2023-02-18 DIAGNOSIS — M25552 Pain in left hip: Secondary | ICD-10-CM | POA: Diagnosis not present

## 2023-02-20 ENCOUNTER — Ambulatory Visit
Admission: RE | Admit: 2023-02-20 | Discharge: 2023-02-20 | Disposition: A | Discharge status: Home / Self Care | Payer: Medicare PPO | Source: Ambulatory Visit | Attending: Internal Medicine | Admitting: Internal Medicine

## 2023-02-20 DIAGNOSIS — M810 Age-related osteoporosis without current pathological fracture: Secondary | ICD-10-CM | POA: Diagnosis not present

## 2023-02-20 DIAGNOSIS — E2839 Other primary ovarian failure: Secondary | ICD-10-CM

## 2023-02-20 DIAGNOSIS — M8580 Other specified disorders of bone density and structure, unspecified site: Secondary | ICD-10-CM

## 2023-02-27 DIAGNOSIS — M5451 Vertebrogenic low back pain: Secondary | ICD-10-CM | POA: Diagnosis not present

## 2023-03-03 DIAGNOSIS — M47816 Spondylosis without myelopathy or radiculopathy, lumbar region: Secondary | ICD-10-CM | POA: Diagnosis not present

## 2023-03-04 DIAGNOSIS — M5451 Vertebrogenic low back pain: Secondary | ICD-10-CM | POA: Diagnosis not present

## 2023-03-09 ENCOUNTER — Ambulatory Visit: Payer: Medicare PPO | Admitting: Neurology

## 2023-03-13 ENCOUNTER — Institutional Professional Consult (permissible substitution): Payer: Medicare PPO | Admitting: Internal Medicine

## 2023-03-15 NOTE — Progress Notes (Unsigned)
[facility]  PATIENT: Beth Aguilar DOB: [birth year]  REFERRING DOCTOR OR PCP: Sanda Linger, MD SOURCE: Patient, notes from primary care, imaging and lab reports, MRI images personally reviewed  _________________________________   HISTORICAL  CHIEF COMPLAINT:  No chief complaint on file.   HISTORY OF PRESENT ILLNESS:  I had the pleasure of seeing a patient, Beth Aguilar, at [facility] for neurologic consultation regarding her memory loss.     Data Imaging: MRI of the brain 08/30/2021 did not show generalized cortical atrophy though there could be minimal medial temporal lobe atrophy..  There are scattered T2/FLAIR hyperintense foci consistent with mild chronic microvascular ischemic change.  There were no comparison films.  Labs 09/01/2022 showed normal vitamin D and TSH.  Lipid panel showed elevated triglycerides and VLDL  REVIEW OF SYSTEMS: Constitutional: No fevers, chills, sweats, or change in appetite Eyes: No visual changes, double vision, eye pain Ear, nose and throat: No hearing loss, ear pain, nasal congestion, sore throat Cardiovascular: No chest pain, palpitations Respiratory:  No shortness of breath at rest or with exertion.   No wheezes GastrointestinaI: No nausea, vomiting, diarrhea, abdominal pain, fecal incontinence Genitourinary:  No dysuria, urinary retention or frequency.  No nocturia. Musculoskeletal:  No neck pain, back pain Integumentary: No rash, pruritus, skin lesions Neurological: as above Psychiatric: No depression at this time.  No anxiety Endocrine: No palpitations, diaphoresis, change in appetite, change in weigh or increased thirst Hematologic/Lymphatic:  No anemia, purpura, petechiae. Allergic/Immunologic: No itchy/runny eyes, nasal congestion, recent allergic reactions, rashes  ALLERGIES: Allergies  Allergen Reactions   Flonase [Fluticasone Propionate] Other (See Comments)    hearing   Compazine  [Prochlorperazine]    Gadolinium Derivatives    Guaifenesin & Derivatives     Felt drunk   Macadamia Nut Oil     Also peanuts and almonds   Pistachio Nut (Diagnostic)    Sulfa Antibiotics     HOME MEDICATIONS:  Current Outpatient Medications:    acetaminophen (TYLENOL) 325 MG tablet, Take 650 mg by mouth at bedtime., Disp: , Rfl:    aspirin 81 MG tablet, Take 1 tablet (81 mg total) by mouth daily., Disp: 100 tablet, Rfl: 99   azelastine (ASTELIN) 0.1 % nasal spray, Place 2 sprays into both nostrils 2 (two) times daily as needed for rhinitis. Annual appt due in March must see provider for future refills, Disp: 30 mL, Rfl: 0   diclofenac (VOLTAREN) 75 MG EC tablet, Take 75 mg by mouth 2 (two) times daily., Disp: , Rfl:    EPINEPHrine 0.3 mg/0.3 mL IJ SOAJ injection, Inject 0.3 mg into the muscle as needed for anaphylaxis. (Patient not taking: Reported on 02/04/2023), Disp: 1 each, Rfl: 1   esomeprazole (NEXIUM) 40 MG capsule, Take 1 capsule (40 mg total) by mouth daily., Disp: 90 capsule, Rfl: 1   rosuvastatin (CRESTOR) 10 MG tablet, Take 1 tablet (10 mg total) by mouth daily., Disp: 90 tablet, Rfl: 3   triamcinolone cream (KENALOG) 0.1 %, Apply 1 application. topically 2 (two) times daily. (Patient taking differently: Apply 1 application  topically as needed.), Disp: 30 g, Rfl: 0  PAST MEDICAL HISTORY: Past Medical History:  Diagnosis Date   Anal fissure    Arthritis    HLD (hyperlipidemia)    HTN (hypertension)    Kidney stone    Urticaria     PAST SURGICAL HISTORY: Past Surgical History:  Procedure Laterality Date   CESAREAN SECTION     x2  Colorectal Surgery     x 2, 1982 and 1991   ROTATOR CUFF REPAIR Left    TONSILLECTOMY AND ADENOIDECTOMY  1965    FAMILY HISTORY: Family History  Problem Relation Age of Onset   Depression Mother    Arthritis Mother    Heart disease Mother    Hypertension Mother    Hyperlipidemia Mother    Stroke Father    Pancreatic cancer  Maternal Uncle    Pancreatic cancer Cousin        uncles daughter    SOCIAL HISTORY: Social History   Socioeconomic History   Marital status: Married    Spouse name: Not on file   Number of children: 2   Years of education: Not on file   Highest education level: Bachelor's degree (e.g., BA, AB, BS)  Occupational History   Occupation: Teacher  Tobacco Use   Smoking status: Never    Passive exposure: Never   Smokeless tobacco: Never  Vaping Use   Vaping Use: Never used  Substance and Sexual Activity   Alcohol use: No   Drug use: No   Sexual activity: Yes    Partners: Male  Other Topics Concern   Not on file  Social History Narrative   Not on file   Social Determinants of Health   Financial Resource Strain: Low Risk  (12/18/2022)   Overall Financial Resource Strain (CARDIA)    Difficulty of Paying Living Expenses: Not hard at all  Food Insecurity: No Food Insecurity (12/18/2022)   Hunger Vital Sign    Worried About Running Out of Food in the Last Year: Never true    Ran Out of Food in the Last Year: Never true  Transportation Needs: No Transportation Needs (12/18/2022)   PRAPARE - Administrator, Civil Service (Medical): No    Lack of Transportation (Non-Medical): No  Physical Activity: Insufficiently Active (12/18/2022)   Exercise Vital Sign    Days of Exercise per Week: 4 days    Minutes of Exercise per Session: 30 min  Stress: No Stress Concern Present (12/18/2022)   [employer] of Occupational Health - Occupational Stress Questionnaire    Feeling of Stress : Not at all  Social Connections: Socially Integrated (12/18/2022)   Social Connection and Isolation Panel [NHANES]    Frequency of Communication with Friends and Family: Once a week    Frequency of Social Gatherings with Friends and Family: Three times a week    Attends Religious Services: More than 4 times per year    Active Member of Clubs or Organizations: Yes    Attends Banker  Meetings: More than 4 times per year    Marital Status: Married  Catering manager Violence: Not At Risk (09/11/2022)   Humiliation, Afraid, Rape, and Kick questionnaire    Fear of Current or Ex-Partner: No    Emotionally Abused: No    Physically Abused: No    Sexually Abused: No       PHYSICAL EXAM  There were no vitals filed for this visit.  There is no height or weight on file to calculate BMI.   General: The patient is well-developed and well-nourished and in no acute distress  HEENT:  Head is [place]/AT.  Sclera are anicteric.  Funduscopic exam shows normal optic discs and retinal vessels.  Neck: No carotid bruits are noted.  The neck is nontender.  Cardiovascular: The heart has a regular rate and rhythm with a normal S1 and S2. There were  no murmurs, gallops or rubs.    Skin: Extremities are without rash or  edema.  Musculoskeletal:  Back is nontender  Neurologic Exam  Mental status: The patient is alert and oriented x 3 at the time of the examination. The patient has apparent normal recent and remote memory, with an apparently normal attention span and concentration ability.   Speech is normal.  Cranial nerves: Extraocular movements are full. Pupils are equal, round, and reactive to light and accomodation.  Visual fields are full.  Facial symmetry is present. There is good facial sensation to soft touch bilaterally.Facial strength is normal.  Trapezius and sternocleidomastoid strength is normal. No dysarthria is noted.  The tongue is midline, and the patient has symmetric elevation of the soft palate. No obvious hearing deficits are noted.  Motor:  Muscle bulk is normal.   Tone is normal. Strength is  5 / 5 in all 4 extremities.   Sensory: Sensory testing is intact to pinprick, soft touch and vibration sensation in all 4 extremities.  Coordination: Cerebellar testing reveals good finger-nose-finger and heel-to-shin bilaterally.  Gait and station: Station is normal.   Gait  is normal. Tandem gait is normal. Romberg is negative.   Reflexes: Deep tendon reflexes are symmetric and normal bilaterally.   Plantar responses are flexor.    DIAGNOSTIC DATA (LABS, IMAGING, TESTING) - I reviewed patient records, labs, notes, testing and imaging myself where available.  Lab Results  Component Value Date   WBC 8.2 12/22/2022   HGB 13.3 12/22/2022   HCT 38.7 12/22/2022   MCV 91.6 12/22/2022   PLT 200.0 12/22/2022      Component Value Date/Time   NA 140 12/22/2022 1423   K 3.9 12/22/2022 1423   CL 104 12/22/2022 1423   CO2 30 12/22/2022 1423   GLUCOSE 101 (H) 12/22/2022 1423   BUN 21 12/22/2022 1423   CREATININE 0.93 12/22/2022 1423   CREATININE 0.92 05/04/2020 1133   CALCIUM 9.4 12/22/2022 1423   PROT 7.2 09/01/2022 1439   ALBUMIN 4.6 09/01/2022 1439   AST 18 09/01/2022 1439   ALT 16 09/01/2022 1439   ALKPHOS 56 09/01/2022 1439   BILITOT 0.3 09/01/2022 1439   Lab Results  Component Value Date   CHOL 170 09/01/2022   HDL 44.00 09/01/2022   LDLCALC 98 05/14/2021   LDLDIRECT 99.0 09/01/2022   TRIG 331.0 (H) 09/01/2022   CHOLHDL 4 09/01/2022   Lab Results  Component Value Date   HGBA1C 5.6 10/03/2020   Lab Results  Component Value Date   VITAMINB12 346 10/03/2020   Lab Results  Component Value Date   TSH 1.69 09/01/2022       ASSESSMENT AND PLAN  ***   Jsoeph Podesta A. Epimenio Foot, MD, [facility] 03/15/2023, 5:52 PM Certified in Neurology, Clinical Neurophysiology, Sleep Medicine and Neuroimaging  [facility] [address], [address] [place], [place] [address] [phone]

## 2023-03-18 ENCOUNTER — Ambulatory Visit: Payer: Medicare PPO | Admitting: Neurology

## 2023-03-18 ENCOUNTER — Encounter: Payer: Self-pay | Admitting: Neurology

## 2023-03-18 VITALS — BP 136/76 | HR 118 | Ht 59.0 in | Wt 124.6 lb

## 2023-03-18 DIAGNOSIS — M5451 Vertebrogenic low back pain: Secondary | ICD-10-CM | POA: Diagnosis not present

## 2023-03-18 DIAGNOSIS — R413 Other amnesia: Secondary | ICD-10-CM | POA: Diagnosis not present

## 2023-03-19 LAB — VITAMIN B12: Vitamin B-12: 395 pg/mL (ref 232–1245)

## 2023-03-23 DIAGNOSIS — M542 Cervicalgia: Secondary | ICD-10-CM | POA: Diagnosis not present

## 2023-04-06 DIAGNOSIS — H9201 Otalgia, right ear: Secondary | ICD-10-CM | POA: Diagnosis not present

## 2023-04-06 DIAGNOSIS — J310 Chronic rhinitis: Secondary | ICD-10-CM | POA: Diagnosis not present

## 2023-04-06 DIAGNOSIS — J343 Hypertrophy of nasal turbinates: Secondary | ICD-10-CM | POA: Diagnosis not present

## 2023-04-06 DIAGNOSIS — J342 Deviated nasal septum: Secondary | ICD-10-CM | POA: Diagnosis not present

## 2023-06-19 ENCOUNTER — Other Ambulatory Visit: Payer: Self-pay | Admitting: Radiology

## 2023-06-19 ENCOUNTER — Other Ambulatory Visit: Payer: Self-pay | Admitting: Internal Medicine

## 2023-06-19 DIAGNOSIS — E782 Mixed hyperlipidemia: Secondary | ICD-10-CM

## 2023-07-24 DIAGNOSIS — Z1231 Encounter for screening mammogram for malignant neoplasm of breast: Secondary | ICD-10-CM | POA: Diagnosis not present

## 2023-07-24 DIAGNOSIS — Z01419 Encounter for gynecological examination (general) (routine) without abnormal findings: Secondary | ICD-10-CM | POA: Diagnosis not present

## 2023-07-24 DIAGNOSIS — M25511 Pain in right shoulder: Secondary | ICD-10-CM | POA: Diagnosis not present

## 2023-07-24 DIAGNOSIS — Z6825 Body mass index (BMI) 25.0-25.9, adult: Secondary | ICD-10-CM | POA: Diagnosis not present

## 2023-08-04 ENCOUNTER — Encounter: Payer: Self-pay | Admitting: Internal Medicine

## 2023-08-04 ENCOUNTER — Ambulatory Visit: Payer: Medicare PPO | Admitting: Internal Medicine

## 2023-08-04 VITALS — BP 128/74 | HR 88 | Temp 98.4°F | Resp 16 | Ht 59.0 in | Wt 125.6 lb

## 2023-08-04 DIAGNOSIS — E785 Hyperlipidemia, unspecified: Secondary | ICD-10-CM | POA: Diagnosis not present

## 2023-08-04 DIAGNOSIS — I10 Essential (primary) hypertension: Secondary | ICD-10-CM | POA: Diagnosis not present

## 2023-08-04 DIAGNOSIS — M25511 Pain in right shoulder: Secondary | ICD-10-CM | POA: Insufficient documentation

## 2023-08-04 LAB — CBC WITH DIFFERENTIAL/PLATELET
Basophils Absolute: 0.1 10*3/uL (ref 0.0–0.1)
Basophils Relative: 0.7 % (ref 0.0–3.0)
Eosinophils Absolute: 0.1 10*3/uL (ref 0.0–0.7)
Eosinophils Relative: 1.1 % (ref 0.0–5.0)
HCT: 42.5 % (ref 36.0–46.0)
Hemoglobin: 14.6 g/dL (ref 12.0–15.0)
Lymphocytes Relative: 22 % (ref 12.0–46.0)
Lymphs Abs: 2.4 10*3/uL (ref 0.7–4.0)
MCHC: 34.3 g/dL (ref 30.0–36.0)
MCV: 91.8 fL (ref 78.0–100.0)
Monocytes Absolute: 0.9 10*3/uL (ref 0.1–1.0)
Monocytes Relative: 8.8 % (ref 3.0–12.0)
Neutro Abs: 7.2 10*3/uL (ref 1.4–7.7)
Neutrophils Relative %: 67.4 % (ref 43.0–77.0)
Platelets: 228 10*3/uL (ref 150.0–400.0)
RBC: 4.63 Mil/uL (ref 3.87–5.11)
RDW: 13 % (ref 11.5–15.5)
WBC: 10.7 10*3/uL — ABNORMAL HIGH (ref 4.0–10.5)

## 2023-08-04 LAB — LIPID PANEL
Cholesterol: 185 mg/dL (ref 0–200)
HDL: 50.8 mg/dL (ref >39.00)
LDL Cholesterol: 77 mg/dL (ref 0–99)
NonHDL: 134.48
Total CHOL/HDL Ratio: 4
Triglycerides: 286 mg/dL — ABNORMAL HIGH (ref 0.0–149.0)
VLDL: 57.2 mg/dL — ABNORMAL HIGH (ref 0.0–40.0)

## 2023-08-04 LAB — HEPATIC FUNCTION PANEL
ALT: 17 U/L (ref 0–35)
AST: 20 U/L (ref 0–37)
Albumin: 4.7 g/dL (ref 3.5–5.2)
Alkaline Phosphatase: 58 U/L (ref 39–117)
Bilirubin, Direct: 0.1 mg/dL (ref 0.0–0.3)
Total Bilirubin: 0.5 mg/dL (ref 0.2–1.2)
Total Protein: 7.5 g/dL (ref 6.0–8.3)

## 2023-08-04 LAB — BASIC METABOLIC PANEL
BUN: 17 mg/dL (ref 6–23)
CO2: 28 meq/L (ref 19–32)
Calcium: 9.8 mg/dL (ref 8.4–10.5)
Chloride: 101 meq/L (ref 96–112)
Creatinine, Ser: 0.95 mg/dL (ref 0.40–1.20)
GFR: 62.41 mL/min (ref >60.00)
Glucose, Bld: 108 mg/dL — ABNORMAL HIGH (ref 70–99)
Potassium: 3.7 meq/L (ref 3.5–5.1)
Sodium: 139 meq/L (ref 135–145)

## 2023-08-04 LAB — MAGNESIUM: Magnesium: 2.2 mg/dL (ref 1.5–2.5)

## 2023-08-04 LAB — CK: Total CK: 126 U/L (ref 7–177)

## 2023-08-04 NOTE — Patient Instructions (Signed)
Shoulder Pain Many things can cause shoulder pain, including: An injury to the shoulder. Overuse of the shoulder. Arthritis. The source of the pain can be: Inflammation. An injury to the shoulder joint. An injury to a tendon, ligament, or bone. Follow these instructions at home: Pay attention to changes in your symptoms. Let your health care provider know about them. Follow these instructions to relieve your pain. If you have a removable sling: Wear the sling as told by your provider. Remove it only as told by your provider. Check the skin around the sling every day. Tell your provider about any concerns. Loosen the sling if your fingers tingle, become numb, or become cold. Keep the sling clean. If the sling is not waterproof: Do not let it get wet. Remove it to shower or bathe. Move your arm as little as possible, but keep your hand moving to prevent swelling. Managing pain, stiffness, and swelling  If told, put ice on the painful area. If you have a removable sling or immobilizer, remove it as told by your provider. Put ice in a plastic bag. Place a towel between your skin and the bag. Leave the ice on for 20 minutes, 2-3 times a day. If your skin turns bright red, remove the ice right away to prevent skin damage. The risk of damage is higher if you cannot feel pain, heat, or cold. Move your fingers often to reduce stiffness and swelling. Squeeze a soft ball or a foam pad as much as possible. This helps to keep the shoulder from swelling. It also helps to strengthen the arm. General instructions Take over-the-counter and prescription medicines only as told by your provider. Exercise may help with pain management. Perform exercises if told by your provider. You may be referred to a physical therapist to help in your recovery process. Keep all follow-up visits in order to avoid any type of permanent shoulder disability or chronic pain problems. Contact a health care provider  if: Your pain is not relieved with medicines. New pain develops in your arm, hand, or fingers. You loosen your sling and your arm, hand, or fingers remain tingly, numb, swollen, or painful. Get help right away if: Your arm, hand, or fingers turn white or blue. This information is not intended to replace advice given to you by your health care provider. Make sure you discuss any questions you have with your health care provider. Document Revised: 04/11/2022 Document Reviewed: 04/11/2022 Elsevier Patient Education  2024 [employer]  

## 2023-08-04 NOTE — Progress Notes (Signed)
Subjective:  Patient ID: Beth Aguilar, female    DOB: 1955/10/05  Age: 67 y.o. MRN: [id number]  CC: Hypertension and Hyperlipidemia   HPI [name] presents for f/up ----  Discussed the use of AI scribe software for clinical note transcription with the patient, who gave verbal consent to proceed.  History of Present Illness   The patient, with a history of back pain and hyperlipidemia, presents with right shoulder pain that began approximately two weeks ago after mowing the lawn. The pain was severe by the end of the day and has persisted since. The patient sought care at an orthopedic urgent care center where an x-ray was performed. The x-ray reportedly showed some bone wear, but the patient was advised to seek further evaluation for suspected rotator cuff injury. The patient has a history of a previous rotator cuff repair on the left side eleven years ago.  The patient describes the pain as severe, but manageable with over-the-counter pain relievers such as Tylenol or extra strength Tylenol. The pain is worse with certain movements, particularly lifting the arm laterally. The patient reports no pain when the arm is kept by the side or moved in a forward direction.  In addition to the shoulder pain, the patient has been experiencing severe muscle spasms, described as "seizures," particularly in the abdomen. These spasms are so severe that they cause the patient to scream and fall out of bed. The patient has been trying to manage these spasms with increased water intake and dietary changes, with some success. The spasms typically occur while the patient is in bed.  The patient is currently on cholesterol medication, which they report is well-tolerated with no known side effects. They have recently received both the flu and COVID-19 vaccines.       Outpatient Medications Prior to Visit  Medication Sig Dispense Refill   acetaminophen (TYLENOL) 325 MG tablet Take 650 mg by mouth at  bedtime.     aspirin 81 MG tablet Take 1 tablet (81 mg total) by mouth daily. 100 tablet 99   CALCIUM CITRATE PO 1 TABLET PO DAILY     Cholecalciferol (VITAMIN D3 PO) 1 CAPSULE PO DAILY     EPINEPHrine 0.3 mg/0.3 mL IJ SOAJ injection Inject 0.3 mg into the muscle as needed for anaphylaxis. 1 each 1   Probiotic Product (PROBIOTIC PO) 1 CAPSULE PO DAILY     rosuvastatin (CRESTOR) 10 MG tablet TAKE 1 TABLET (10 MG TOTAL) BY MOUTH DAILY. 90 tablet 3   triamcinolone cream (KENALOG) 0.1 % Apply 1 application. topically 2 (two) times daily. (Patient taking differently: Apply 1 application  topically as needed.) 30 g 0   azelastine (ASTELIN) 0.1 % nasal spray Place 2 sprays into both nostrils 2 (two) times daily as needed for rhinitis. Annual appt due in March must see provider for future refills 30 mL 0   No facility-administered medications prior to visit.    ROS Review of Systems  Constitutional:  Negative for chills, diaphoresis and fatigue.  HENT: Negative.    Respiratory: Negative.  Negative for cough, chest tightness, shortness of breath and wheezing.   Cardiovascular:  Negative for chest pain, palpitations and leg swelling.  Gastrointestinal:  Negative for abdominal pain, constipation, diarrhea and vomiting.  Genitourinary: Negative.  Negative for difficulty urinating.  Musculoskeletal:  Positive for arthralgias. Negative for back pain and myalgias.  Neurological:  Negative for dizziness and weakness.  Hematological:  Negative for adenopathy. Does not bruise/bleed easily.  Psychiatric/Behavioral:  Positive for confusion and decreased concentration. Negative for dysphoric mood and suicidal ideas. The patient is nervous/anxious.     Objective:  BP 128/74 (BP Location: Left Arm, Patient Position: Sitting, Cuff Size: Normal)   Pulse 88   Temp 98.4 F (36.9 C) (Oral)   Resp 16   Ht 4\' 11"  (1.499 m)   Wt 125 lb 9.6 oz (57 kg)   SpO2 94%   BMI 25.37 kg/m   BP Readings from Last 3  Encounters:  08/04/23 128/74  03/18/23 136/76  02/04/23 (!) 164/90    Wt Readings from Last 3 Encounters:  08/04/23 125 lb 9.6 oz (57 kg)  03/18/23 124 lb 9.6 oz (56.5 kg)  02/04/23 127 lb 8 oz (57.8 kg)    Physical Exam Vitals reviewed.  Constitutional:      Appearance: Normal appearance.  HENT:     Mouth/Throat:     Mouth: Mucous membranes are moist.  Eyes:     General: No scleral icterus.    Conjunctiva/sclera: Conjunctivae normal.  Cardiovascular:     Rate and Rhythm: Normal rate and regular rhythm.     Heart sounds: No murmur heard.    No friction rub. No gallop.  Pulmonary:     Effort: Pulmonary effort is normal.     Breath sounds: No stridor. No wheezing, rhonchi or rales.  Abdominal:     General: Abdomen is flat.     Palpations: There is no mass.     Tenderness: There is no abdominal tenderness. There is no guarding.     Hernia: No hernia is present.  Musculoskeletal:        General: No swelling or tenderness. Normal range of motion.     Right shoulder: Bony tenderness present. No swelling, deformity, tenderness or crepitus. Normal strength. Normal pulse.     Left shoulder: Normal. No swelling.     Cervical back: Neck supple.     Right lower leg: No edema.     Left lower leg: No edema.  Lymphadenopathy:     Cervical: No cervical adenopathy.  Skin:    General: Skin is warm and dry.     Coloration: Skin is not pale.     Findings: No rash.  Neurological:     General: No focal deficit present.     Mental Status: She is alert. Mental status is at baseline.  Psychiatric:        Mood and Affect: Mood normal.        Behavior: Behavior normal.     Lab Results  Component Value Date   WBC 10.7 (H) 08/04/2023   HGB 14.6 08/04/2023   HCT 42.5 08/04/2023   PLT 228.0 08/04/2023   GLUCOSE 108 (H) 08/04/2023   CHOL 185 08/04/2023   TRIG 286.0 (H) 08/04/2023   HDL 50.80 08/04/2023   LDLDIRECT 99.0 09/01/2022   LDLCALC 77 08/04/2023   ALT 17 08/04/2023   AST  20 08/04/2023   NA 139 08/04/2023   K 3.7 08/04/2023   CL 101 08/04/2023   CREATININE 0.95 08/04/2023   BUN 17 08/04/2023   CO2 28 08/04/2023   TSH 1.69 09/01/2022   INR 1.0 12/22/2022   HGBA1C 5.6 10/03/2020    DG BONE DENSITY (DXA)  Result Date: 02/20/2023 EXAM: DUAL X-RAY ABSORPTIOMETRY (DXA) FOR BONE MINERAL DENSITY IMPRESSION: Referring Physician:  Etta Grandchild Your patient completed a bone mineral density test using GE Lunar iDXA system (analysis version: 16). Technologist:    lmn PATIENT:  Name: Ninna, Wilt Patient ID: [id number] Birth Date: [birth year] Height: 59.6 in. Sex: Female Measured: 02/20/2023 Weight: 124.4 lbs. Indications: Caucasian, Estrogen Deficient, Family Hist. (Parent hip fracture) Fractures: NONE Treatments: Calcium (E943.0), Vitamin D (E933.5) ASSESSMENT: The BMD measured at AP Spine L1-L2 is 0.894 g/cm2 with a T-score of -2.3. This patient is considered osteopenic/low bone mass according to World Health Organization [facility]) criteria. The quality of the exam is good. L3 and L4 were excluded due to degenerative changes. Site Region Measured Date Measured Age YA BMD Significant CHANGE T-score AP Spine  L1-L2      02[age]20/2024    66.1         -2.3    0.894 g/cm2 DualFemur Neck Left  02/20/2023    66.1         -2.1    0.744 g/cm2 DualFemur Neck Left  02/18/2013    56.1         -2.0    0.759 g/cm2 DualFemur Total Mean 02/20/2023    66.1         -0.9    0.898 g/cm2 World Health Organization [facility]) criteria for post-menopausal, Caucasian Women: Normal       T-score at or above -1 SD Osteopenia   T-score between -1 and -2.5 SD Osteoporosis T-score at or below -2.5 SD RECOMMENDATION: 1. All patients should optimize calcium and vitamin D intake. 2. Consider FDA-approved medical therapies in postmenopausal women and men aged 78 years and older, based on the following: a. A hip or vertebral (clinical or morphometric) fracture. b. T-score = -2.5 at the femoral neck or spine after appropriate  evaluation to exclude secondary causes. c. Low bone mass (T-score between -1.0 and -2.5 at the femoral neck or spine) and a 10-year probability of a hip fracture = 3% or a 10-year probability of a major osteoporosis-related fracture = 20% based on the [place]-adapted WHO algorithm. d. Clinician judgment and/or patient preferences may indicate treatment for people with 10-year fracture probabilities above or below these levels. FOLLOW-UP: Patients with diagnosis of osteoporosis or at high risk for fracture should have regular bone mineral density tests. Patients eligible for Medicare are allowed routine testing every 2 years. The testing frequency can be increased to one year for patients who have rapidly progressing disease, are receiving or discontinuing medical therapy to restore bone mass, or have additional risk factors. I have reviewed this study and agree with the findings. [facility] FRAX* 10-year Probability of Fracture Based on femoral neck BMD: DualFemur (Left) Major Osteoporotic Fracture: 20.6% Hip Fracture:                2.5% Population:                  [place] (Caucasian) Risk Factors:                Family Hist. (Parent hip fracture) *FRAX is a Armed forces logistics/support/administrative officer of the [employer] for Metabolic Bone Disease, a World Science writer (WHO) [employer]. ASSESSMENT: The probability of a major osteoporotic fracture is 20.6% within the next ten years. The probability of a hip fracture is 2.5% within the next ten years. Electronically Signed   By: Frederico Hamman M.D.   On: 02/20/2023 08:02    Assessment & Plan:   Dyslipidemia, goal LDL below 100 - LDL goal achieved. Doing well on the statin  -     Lipid panel; Future -     Hepatic function panel;  Future -     CK; Future  Primary hypertension - Her BP is well controlled. -     Basic metabolic panel; Future -     CBC with Differential/Platelet; Future -     Magnesium; Future  Acute pain of  right shoulder -     Ambulatory referral to Orthopedic Surgery     Follow-up: Return in about 3 months (around 11/04/2023).  Sanda Linger, MD

## 2023-08-10 ENCOUNTER — Telehealth: Payer: Self-pay | Admitting: Internal Medicine

## 2023-08-10 NOTE — Telephone Encounter (Signed)
Pt called checking on the status of her referral to the orthopedic because she states she is in severe pain. Please advise.

## 2023-08-12 DIAGNOSIS — M25511 Pain in right shoulder: Secondary | ICD-10-CM | POA: Diagnosis not present

## 2023-09-10 DIAGNOSIS — M6281 Muscle weakness (generalized): Secondary | ICD-10-CM | POA: Diagnosis not present

## 2023-09-10 DIAGNOSIS — M67813 Other specified disorders of tendon, right shoulder: Secondary | ICD-10-CM | POA: Diagnosis not present

## 2023-09-18 DIAGNOSIS — M6281 Muscle weakness (generalized): Secondary | ICD-10-CM | POA: Diagnosis not present

## 2023-09-18 DIAGNOSIS — M67813 Other specified disorders of tendon, right shoulder: Secondary | ICD-10-CM | POA: Diagnosis not present

## 2023-09-21 DIAGNOSIS — M67813 Other specified disorders of tendon, right shoulder: Secondary | ICD-10-CM | POA: Diagnosis not present

## 2023-09-21 DIAGNOSIS — M6281 Muscle weakness (generalized): Secondary | ICD-10-CM | POA: Diagnosis not present

## 2023-09-23 DIAGNOSIS — L300 Nummular dermatitis: Secondary | ICD-10-CM | POA: Diagnosis not present

## 2023-09-25 DIAGNOSIS — M25511 Pain in right shoulder: Secondary | ICD-10-CM | POA: Diagnosis not present

## 2023-09-25 DIAGNOSIS — M79661 Pain in right lower leg: Secondary | ICD-10-CM | POA: Diagnosis not present

## 2023-09-25 DIAGNOSIS — M25561 Pain in right knee: Secondary | ICD-10-CM | POA: Diagnosis not present

## 2023-10-01 ENCOUNTER — Ambulatory Visit: Payer: Medicare PPO | Admitting: Neurology

## 2023-10-21 DIAGNOSIS — M25561 Pain in right knee: Secondary | ICD-10-CM | POA: Diagnosis not present

## 2023-10-30 DIAGNOSIS — M25561 Pain in right knee: Secondary | ICD-10-CM | POA: Diagnosis not present

## 2023-11-04 DIAGNOSIS — M25561 Pain in right knee: Secondary | ICD-10-CM | POA: Diagnosis not present

## 2023-12-01 ENCOUNTER — Telehealth: Payer: Self-pay

## 2023-12-01 NOTE — Telephone Encounter (Signed)
 Copied from CRM [id number]. Topic: Clinical - Medical Advice >> Dec 01, 2023  4:23 PM Beth Aguilar wrote:  Reason for CRM: Patient wants to confirm if she needs to fast for her physical & AWV.

## 2023-12-02 DIAGNOSIS — M25561 Pain in right knee: Secondary | ICD-10-CM | POA: Diagnosis not present

## 2023-12-02 NOTE — Telephone Encounter (Signed)
 Patient has been made aware to fast prior to her physical.

## 2023-12-04 DIAGNOSIS — M25561 Pain in right knee: Secondary | ICD-10-CM | POA: Diagnosis not present

## 2023-12-04 DIAGNOSIS — M25661 Stiffness of right knee, not elsewhere classified: Secondary | ICD-10-CM | POA: Diagnosis not present

## 2023-12-25 DIAGNOSIS — M25561 Pain in right knee: Secondary | ICD-10-CM | POA: Diagnosis not present

## 2023-12-25 DIAGNOSIS — M25661 Stiffness of right knee, not elsewhere classified: Secondary | ICD-10-CM | POA: Diagnosis not present

## 2023-12-31 ENCOUNTER — Encounter: Payer: Medicare PPO | Admitting: Internal Medicine

## 2023-12-31 ENCOUNTER — Ambulatory Visit: Payer: Medicare PPO

## 2024-01-02 IMAGING — DX DG HIP (WITH OR WITHOUT PELVIS) 2-3V*R*
3 series · 3 of 3 positions shown · non-contrast
Comparison: None.

CLINICAL DATA: Acute right-sided low back pain with right-sided
sciatica. Right hip pain for 2 weeks.

EXAM:
DG HIP (WITH OR WITHOUT PELVIS) 2-3V RIGHT

[pelvis ap]
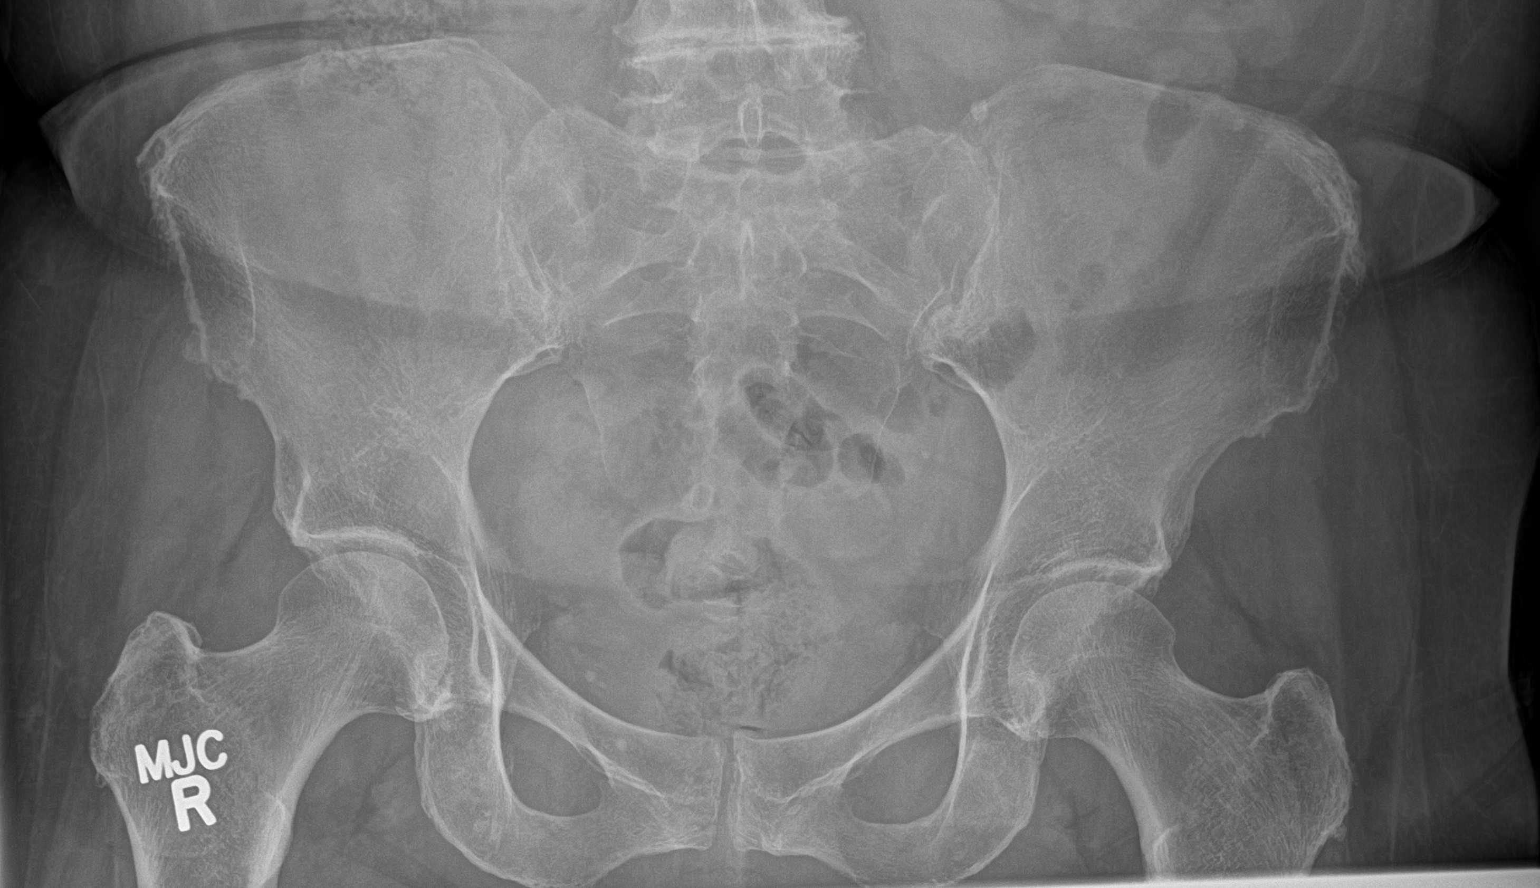

[hip ap]
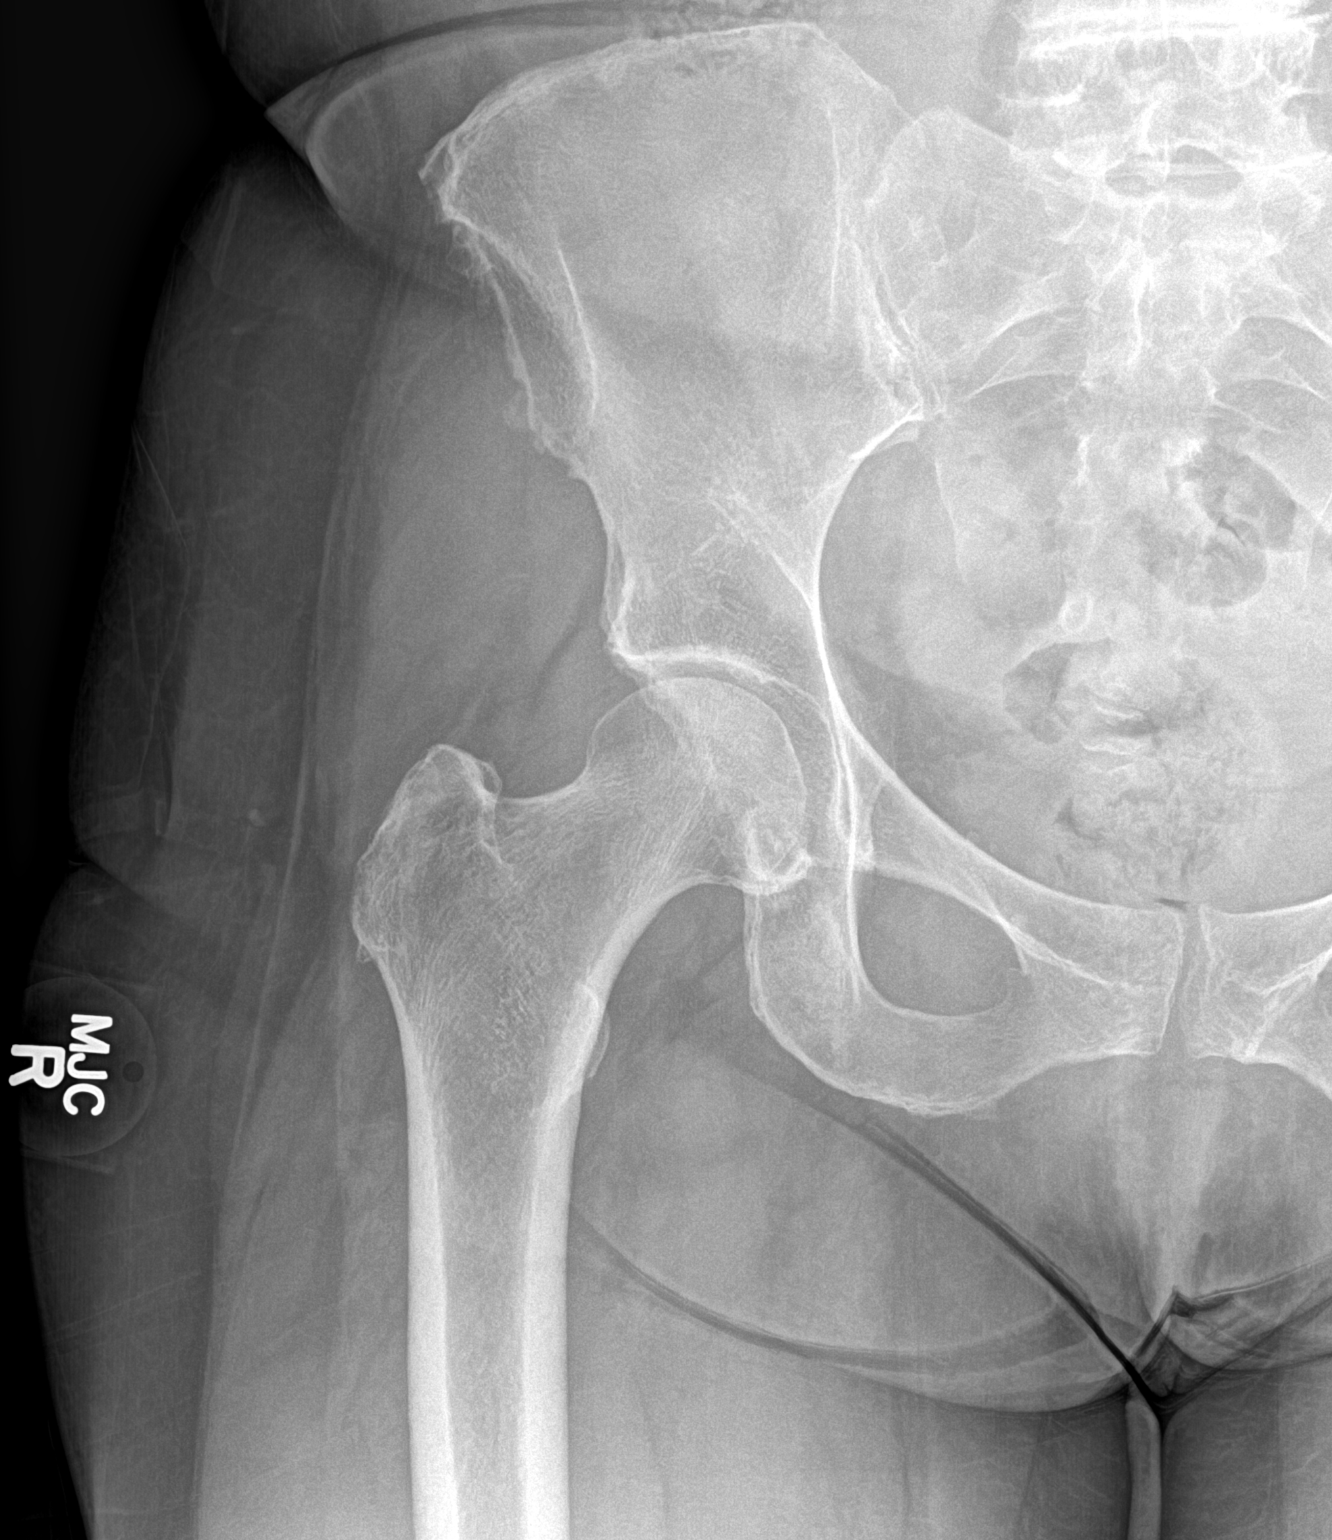

[hip frog leg]
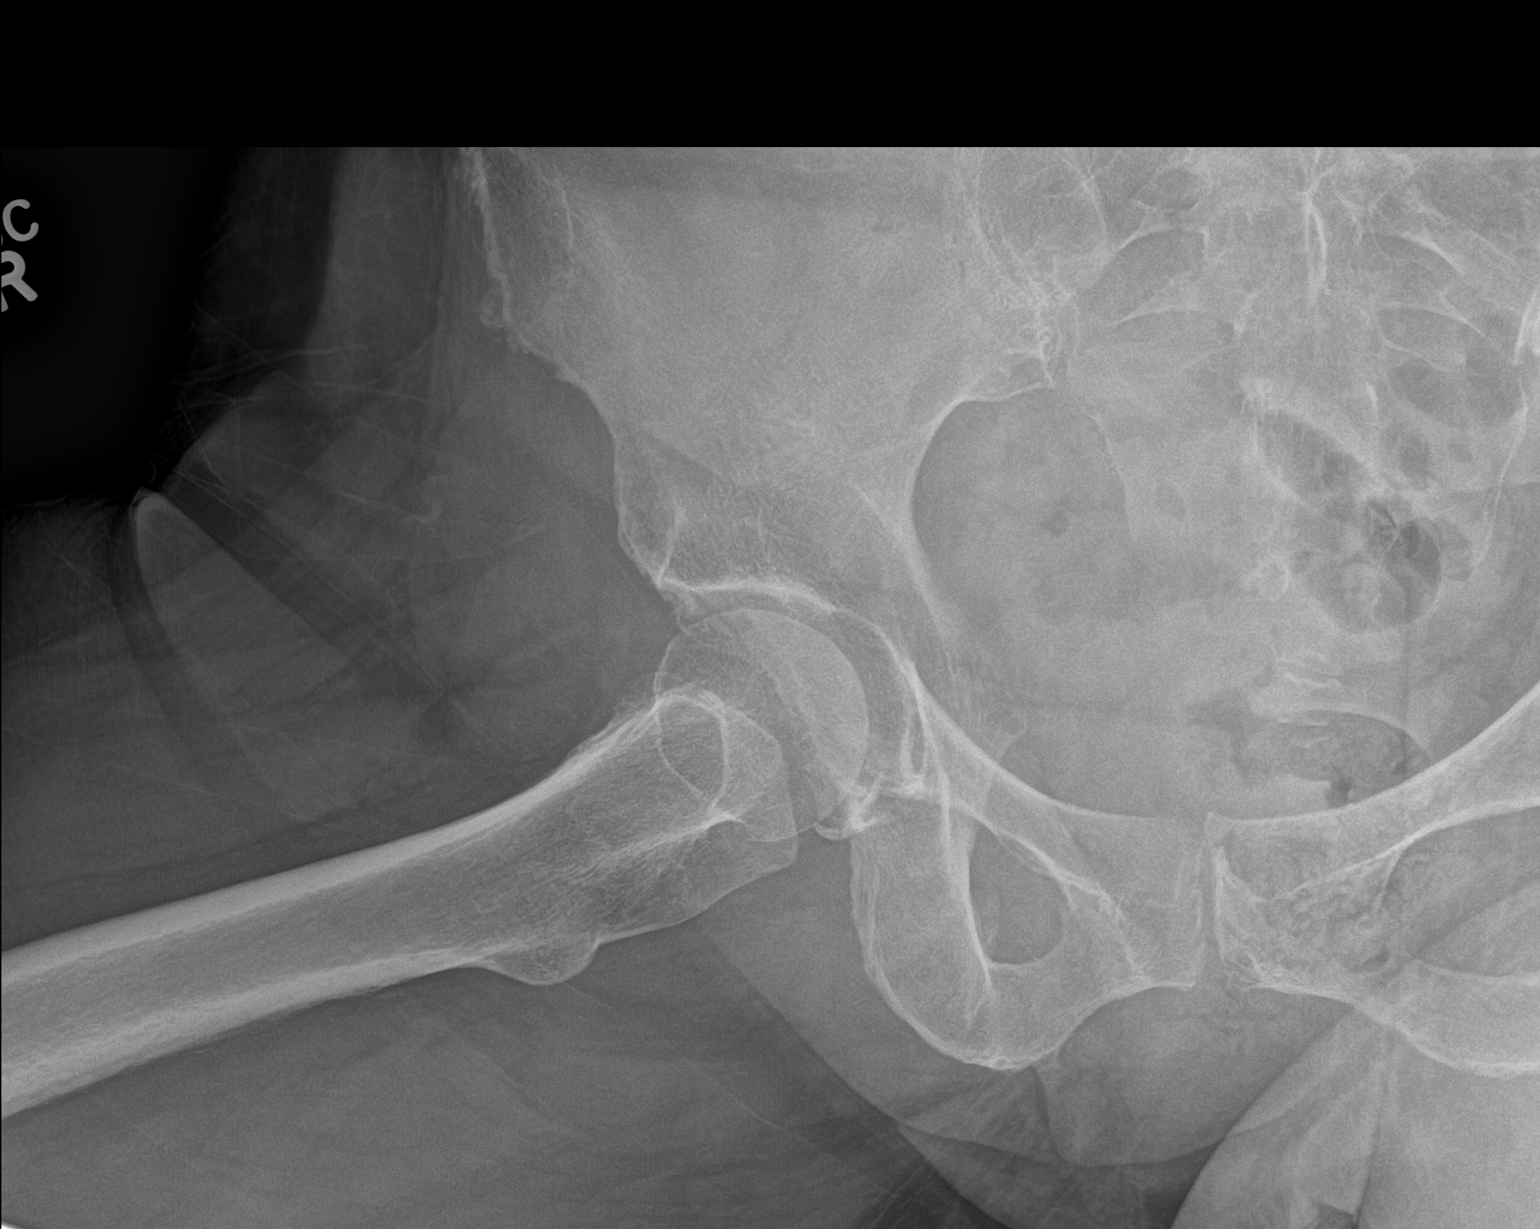

[3 of 3 positions shown; findings below may reference images not displayed]

FINDINGS: Slight right hip joint space narrowing and acetabular spurring. The
femoral head is well seated. There is no fracture. No erosion,
evidence of a vascular necrosis or bony destruction. Pubic rami are
intact. Pubic symphysis and sacroiliac joints are congruent.
Unremarkable soft tissues.
IMPRESSION: Minor right hip osteoarthritis.

## 2024-01-02 IMAGING — DX DG KNEE COMPLETE 4+V*R*
4 series · 4 of 4 positions shown · non-contrast
Comparison: None.

CLINICAL DATA: Acute pain of right knee.  Pain for 2 weeks.

EXAM:
RIGHT KNEE - COMPLETE 4+ VIEW

[knee ap]
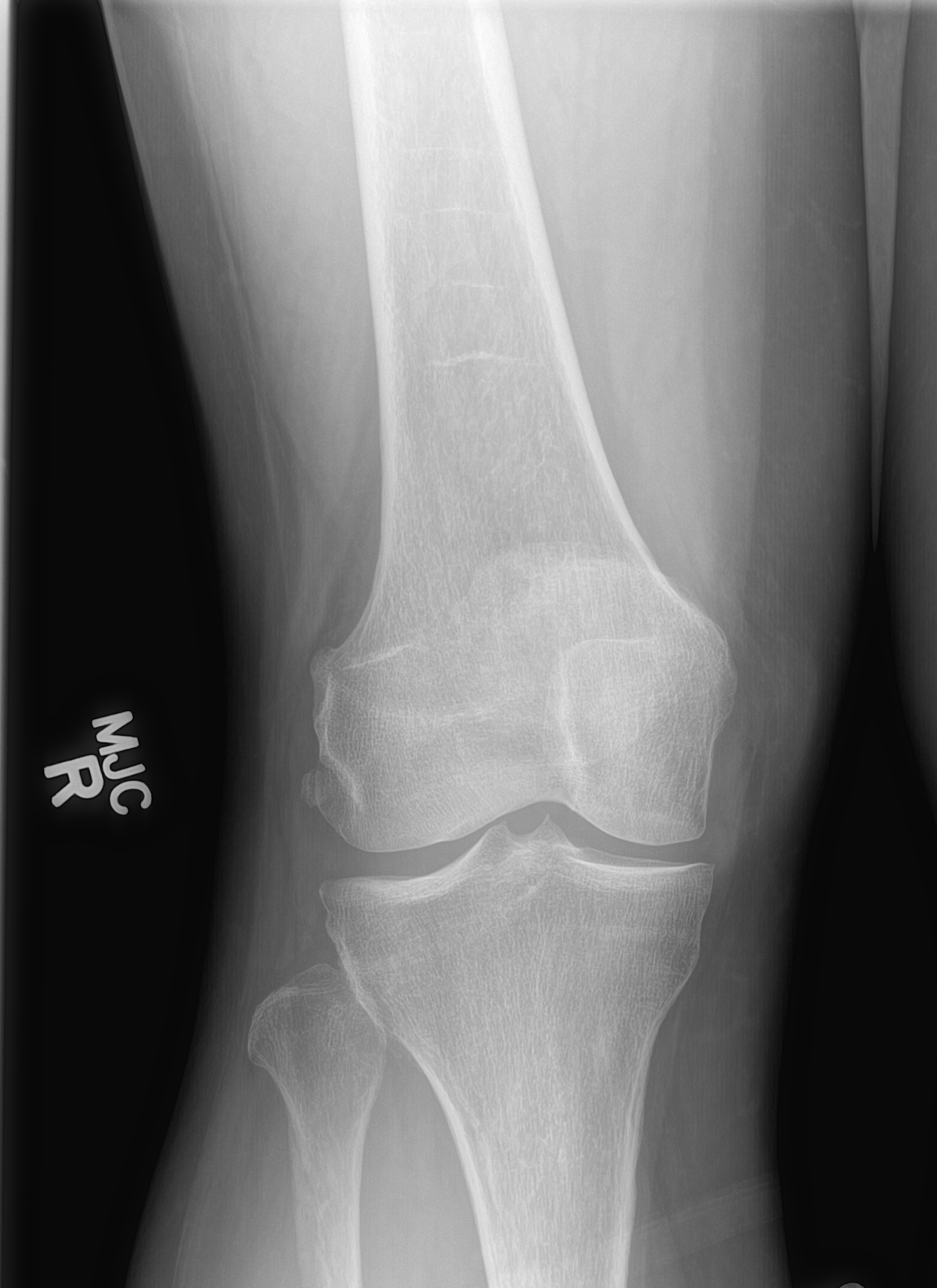

[knee obl (1 of 2)]
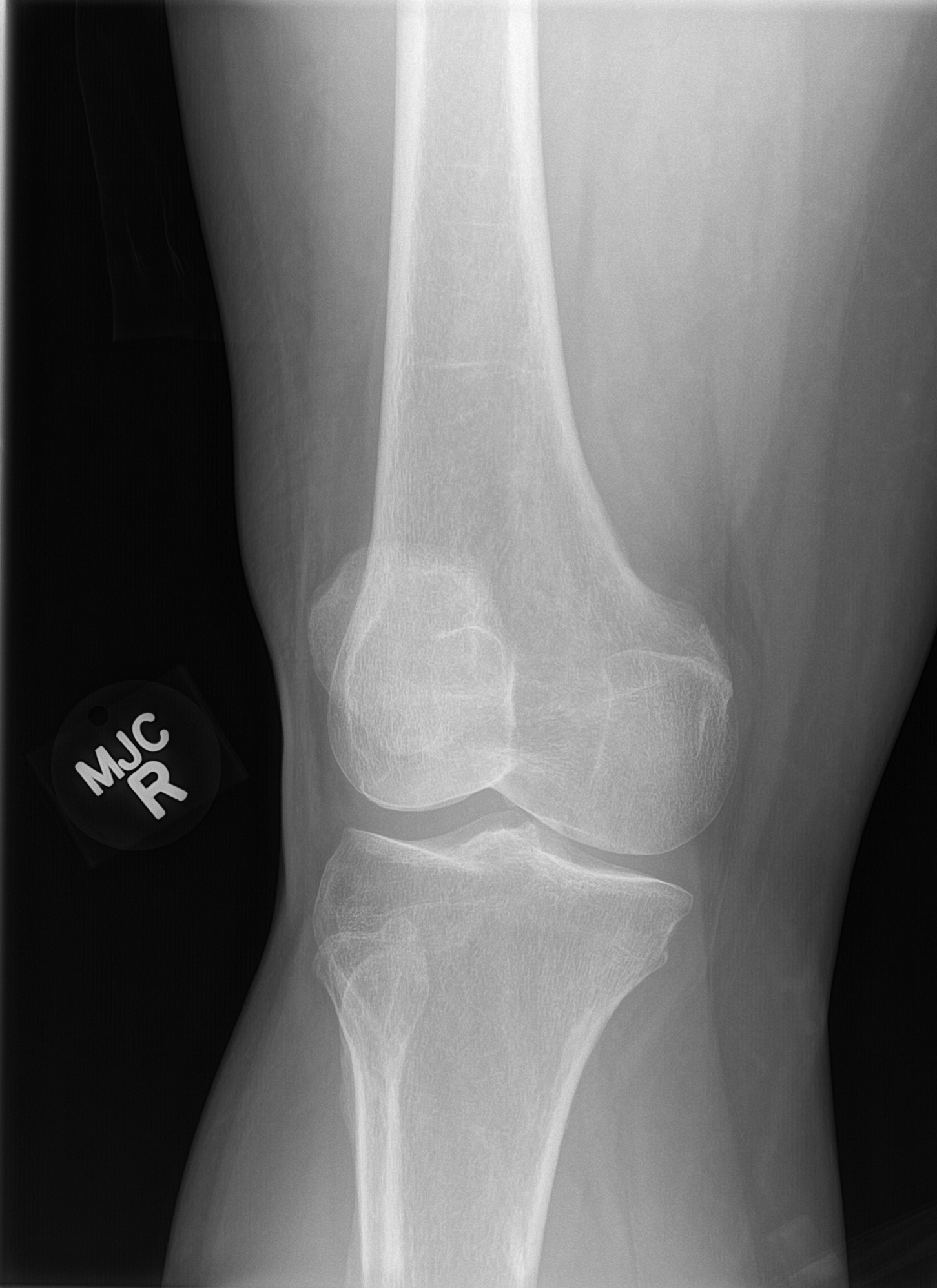

[knee obl (2 of 2)]
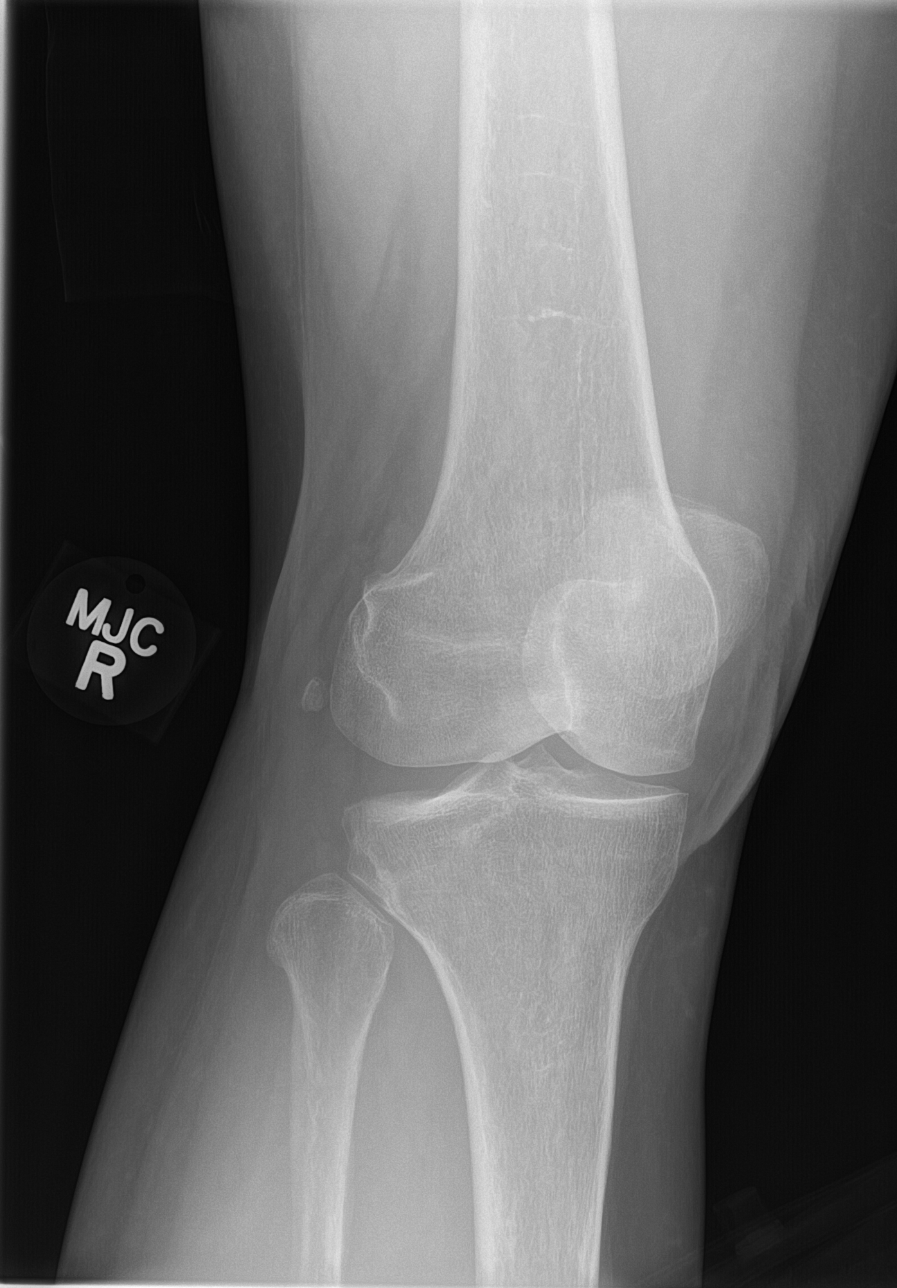

[knee lat]
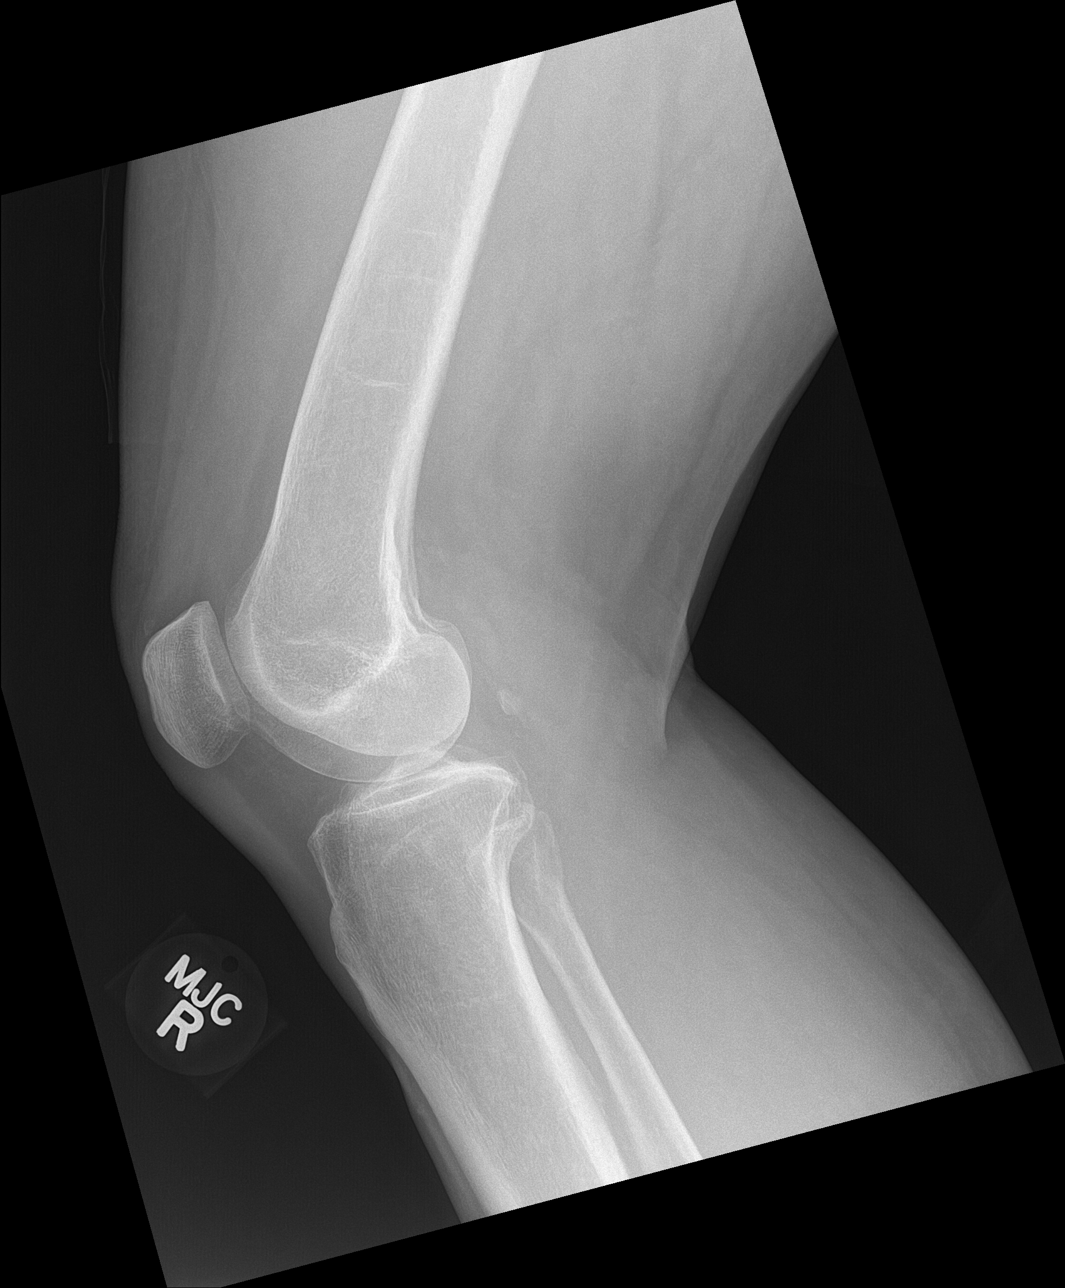

[4 of 4 positions shown; findings below may reference images not displayed]

FINDINGS: Normal alignment. Normal joint spaces. Trace spurring of the medial
tibiofemoral compartment and tibial spines. No fracture, erosion, or
focal bone abnormality. No knee joint effusion. Unremarkable soft
tissues.
IMPRESSION: Trace osteoarthritis.  No acute osseous findings.

## 2024-01-05 DIAGNOSIS — M25561 Pain in right knee: Secondary | ICD-10-CM | POA: Diagnosis not present

## 2024-01-05 DIAGNOSIS — M25661 Stiffness of right knee, not elsewhere classified: Secondary | ICD-10-CM | POA: Diagnosis not present

## 2024-01-06 ENCOUNTER — Ambulatory Visit

## 2024-01-06 ENCOUNTER — Ambulatory Visit: Admitting: Internal Medicine

## 2024-01-06 VITALS — BP 118/62 | HR 71 | Ht 62.5 in | Wt 126.0 lb

## 2024-01-06 DIAGNOSIS — Z0000 Encounter for general adult medical examination without abnormal findings: Secondary | ICD-10-CM

## 2024-01-06 NOTE — Patient Instructions (Addendum)
 Ms. Hegwood , Thank you for taking time to come for your Medicare Wellness Visit. I appreciate your ongoing commitment to your health goals. Please review the following plan we discussed and let me know if I can assist you in the future.   Referrals/Orders/Follow-Ups/Clinician Recommendations: Aim for 30 minutes of exercise or brisk walking, 6-8 glasses of water, and 5 servings of fruits and vegetables each day. Educated and advised on having the Tdap vaccine in 2025.  Mammogram due and ordered by Gyn for 2025.  This is a list of the screening recommended for you and due dates:  Health Maintenance  Topic Date Due   COVID-19 Vaccine (4 - 2024-25 season) 05/24/2023   Pneumonia Vaccine (1 of 1 - PCV) 01/05/2025*   Flu Shot  04/22/2024   DTaP/Tdap/Td vaccine (2 - Td or Tdap) 04/23/2024   Mammogram  05/07/2024   Medicare Annual Wellness Visit  01/05/2025   Cologuard (Stool DNA test)  09/10/2025   DEXA scan (bone density measurement)  Completed   Hepatitis C Screening  Completed   Zoster (Shingles) Vaccine  Completed   HPV Vaccine  Aged Out   Meningitis B Vaccine  Aged Out   Colon Cancer Screening  Discontinued  *Topic was postponed. The date shown is not the original due date.    Advanced directives: (Copy Requested) Please bring a copy of your health care power of attorney and living will to the office to be added to your chart at your convenience. You can mail to [facility] [address] [place], [place] [address] or email to [email or web address]  Next Medicare Annual Wellness Visit scheduled for next year: Yes

## 2024-01-06 NOTE — Progress Notes (Signed)
 Subjective:  Patient ID: Beth Aguilar, female    DOB: August 23, 1956  Age: 68 y.o. MRN: [id number]  CC: No chief complaint on file.   HPI Novalee Horsfall presents for not seen  Outpatient Medications Prior to Visit  Medication Sig Dispense Refill   acetaminophen (TYLENOL) 325 MG tablet Take 650 mg by mouth at bedtime.     aspirin  81 MG tablet Take 1 tablet (81 mg total) by mouth daily. 100 tablet 99   CALCIUM  CITRATE PO 1 TABLET PO DAILY     Cholecalciferol (VITAMIN D3 PO) 1 CAPSULE PO DAILY     EPINEPHrine  0.3 mg/0.3 mL IJ SOAJ injection Inject 0.3 mg into the muscle as needed for anaphylaxis. 1 each 1   Melatonin 1 MG CAPS Take 1 capsule by mouth at bedtime. As needed     naproxen (NAPROSYN) 500 MG tablet Take 500 mg by mouth 2 (two) times daily with a meal. As needed - [facility]     Probiotic Product (PROBIOTIC PO) 1 CAPSULE PO DAILY     rosuvastatin  (CRESTOR ) 10 MG tablet TAKE 1 TABLET (10 MG TOTAL) BY MOUTH DAILY. 90 tablet 3   triamcinolone  cream (KENALOG ) 0.1 % Apply 1 application. topically 2 (two) times daily. (Patient taking differently: Apply 1 application  topically as needed.) 30 g 0   No facility-administered medications prior to visit.    ROS Review of Systems  Objective:  There were no vitals taken for this visit.  BP Readings from Last 3 Encounters:  01/06/24 118/62  08/04/23 128/74  03/18/23 136/76    Wt Readings from Last 3 Encounters:  01/06/24 126 lb (57.2 kg)  08/04/23 125 lb 9.6 oz (57 kg)  03/18/23 124 lb 9.6 oz (56.5 kg)    Physical Exam  Lab Results  Component Value Date   WBC 10.7 (H) 08/04/2023   HGB 14.6 08/04/2023   HCT 42.5 08/04/2023   PLT 228.0 08/04/2023   GLUCOSE 108 (H) 08/04/2023   CHOL 185 08/04/2023   TRIG 286.0 (H) 08/04/2023   HDL 50.80 08/04/2023   LDLDIRECT 99.0 09/01/2022   LDLCALC 77 08/04/2023   ALT 17 08/04/2023   AST 20 08/04/2023   NA 139 08/04/2023   K 3.7 08/04/2023   CL 101 08/04/2023    CREATININE 0.95 08/04/2023   BUN 17 08/04/2023   CO2 28 08/04/2023   TSH 1.69 09/01/2022   INR 1.0 12/22/2022   HGBA1C 5.6 10/03/2020    DG BONE DENSITY (DXA) Result Date: 02/20/2023 EXAM: DUAL X-RAY ABSORPTIOMETRY (DXA) FOR BONE MINERAL DENSITY IMPRESSION: Referring Physician:  Arcadio Knuckles Your patient completed a bone mineral density test using GE Lunar iDXA system (analysis version: 16). Technologist:    lmn PATIENT: Name: Beth, Aguilar Patient ID: [id number] Birth Date: Mar 09, 1956 Height: 59.6 in. Sex: Female Measured: 02/20/2023 Weight: 124.4 lbs. Indications: Caucasian, Estrogen Deficient, Family Hist. (Parent hip fracture) Fractures: NONE Treatments: Calcium  (E943.0), Vitamin D  (E933.5) ASSESSMENT: The BMD measured at AP Spine L1-L2 is 0.894 g/cm2 with a T-score of -2.3. This patient is considered osteopenic/low bone mass according to World Health Organization [facility]) criteria. The quality of the exam is good. L3 and L4 were excluded due to degenerative changes. Site Region Measured Date Measured Age YA BMD Significant CHANGE T-score AP Spine  L1-L2      3/20/2024    66.1         -2.3    0.894 g/cm2 DualFemur Neck Left  02/20/2023    66.1         -  2.1    0.744 g/cm2 DualFemur Neck Left  02/18/2013    56.1         -2.0    0.759 g/cm2 DualFemur Total Mean 02/20/2023    66.1         -0.9    0.898 g/cm2 World Health Organization [facility]) criteria for post-menopausal, Caucasian Women: Normal       T-score at or above -1 SD Osteopenia   T-score between -1 and -2.5 SD Osteoporosis T-score at or below -2.5 SD RECOMMENDATION: 1. All patients should optimize calcium  and vitamin D  intake. 2. Consider FDA-approved medical therapies in postmenopausal women and men aged 60 years and older, based on the following: a. A hip or vertebral (clinical or morphometric) fracture. b. T-score = -2.5 at the femoral neck or spine after appropriate evaluation to exclude secondary causes. c. Low bone mass (T-score between -1.0  and -2.5 at the femoral neck or spine) and a 10-year probability of a hip fracture = 3% or a 10-year probability of a major osteoporosis-related fracture = 20% based on the [place] -adapted WHO algorithm. d. Clinician judgment and/or patient preferences may indicate treatment for people with 10-year fracture probabilities above or below these levels. FOLLOW-UP: Patients with diagnosis of osteoporosis or at high risk for fracture should have regular bone mineral density tests. Patients eligible for Medicare are allowed routine testing every 2 years. The testing frequency can be increased to one year for patients who have rapidly progressing disease, are receiving or discontinuing medical therapy to restore bone mass, or have additional risk factors. I have reviewed this study and agree with the findings. [facility] FRAX* 10-year Probability of Fracture Based on femoral neck BMD: DualFemur (Left) Major Osteoporotic Fracture: 20.6% Hip Fracture:                2.5% Population:                  [place]  (Caucasian) Risk Factors:                Family Hist. (Parent hip fracture) *FRAX is a Armed forces logistics/support/administrative officer of the [employer] for Metabolic Bone Disease, a World Science writer (WHO) [employer]. ASSESSMENT: The probability of a major osteoporotic fracture is 20.6% within the next ten years. The probability of a hip fracture is 2.5% within the next ten years. Electronically Signed   By: Alinda Apley M.D.   On: 02/20/2023 08:02    Assessment & Plan:  There are no diagnoses linked to this encounter.   Follow-up: No follow-ups on file.  Sandra Crouch, MD

## 2024-01-06 NOTE — Progress Notes (Signed)
 Subjective:   Beth Aguilar is a 68 y.o. who presents for a Medicare Wellness preventive visit.  Visit Complete: In person  Persons Participating in Visit: Patient.  AWV Questionnaire: No: Patient Medicare AWV questionnaire was not completed prior to this visit.  Cardiac Risk Factors include: advanced age (>70men, >68 women);hypertension;dyslipidemia     Objective:    Today's Vitals   01/06/24 1308  BP: 118/62  Pulse: 71  SpO2: 97%  Weight: 126 lb (57.2 kg)  Height: 5' 2.5" (1.588 m)   Body mass index is 22.68 kg/m.     09/11/2022    2:38 PM 10/12/2019    5:40 PM  Advanced Directives  Does Patient Have a Medical Advance Directive? No No  Would patient like information on creating a medical advance directive? No - Patient declined No - Patient declined    Current Medications (verified) Outpatient Encounter Medications as of 01/06/2024  Medication Sig   acetaminophen (TYLENOL) 325 MG tablet Take 650 mg by mouth at bedtime.   aspirin 81 MG tablet Take 1 tablet (81 mg total) by mouth daily.   CALCIUM CITRATE PO 1 TABLET PO DAILY   Cholecalciferol (VITAMIN D3 PO) 1 CAPSULE PO DAILY   EPINEPHrine 0.3 mg/0.3 mL IJ SOAJ injection Inject 0.3 mg into the muscle as needed for anaphylaxis.   Melatonin 1 MG CAPS Take 1 capsule by mouth at bedtime. As needed   naproxen (NAPROSYN) 500 MG tablet Take 500 mg by mouth 2 (two) times daily with a meal. As needed - [facility]   Probiotic Product (PROBIOTIC PO) 1 CAPSULE PO DAILY   rosuvastatin (CRESTOR) 10 MG tablet TAKE 1 TABLET (10 MG TOTAL) BY MOUTH DAILY.   triamcinolone cream (KENALOG) 0.1 % Apply 1 application. topically 2 (two) times daily. (Patient taking differently: Apply 1 application  topically as needed.)   No facility-administered encounter medications on file as of 01/06/2024.    Allergies (verified) Flonase [fluticasone propionate], Compazine [prochlorperazine], Gadolinium derivatives, Guaifenesin &  derivatives, Macadamia nut oil, Pistachio nut (diagnostic), and Sulfa antibiotics   History: Past Medical History:  Diagnosis Date   Anal fissure    Arthritis    Hearing loss    HLD (hyperlipidemia)    HTN (hypertension)    Kidney stone    Urticaria    Past Surgical History:  Procedure Laterality Date   CESAREAN SECTION     x2   Colorectal Surgery     x 2, 1982 and 1991   ROTATOR CUFF REPAIR Left    TONSILLECTOMY AND ADENOIDECTOMY  1965   Family History  Problem Relation Age of Onset   Depression Mother    Arthritis Mother    Heart disease Mother    Hypertension Mother    Hyperlipidemia Mother    Stroke Father    Pancreatic cancer Maternal Uncle    Pancreatic cancer Cousin        uncles daughter   Social History   Socioeconomic History   Marital status: Married    Spouse name: Not on file   Number of children: 2   Years of education: Not on file   Highest education level: Bachelor's degree (e.g., BA, AB, BS)  Occupational History   Occupation: Teacher  Tobacco Use   Smoking status: Never    Passive exposure: Never   Smokeless tobacco: Never  Vaping Use   Vaping status: Never Used  Substance and Sexual Activity   Alcohol use: No   Drug use: No  Sexual activity: Yes    Partners: Male  Other Topics Concern   Not on file  Social History Narrative   Right handed   Wear glasses   Drinks 1 oz of coffee daily    Social Drivers of Health   Financial Resource Strain: Low Risk  (01/06/2024)   Overall Financial Resource Strain (CARDIA)    Difficulty of Paying Living Expenses: Not hard at all  Food Insecurity: No Food Insecurity (01/06/2024)   Hunger Vital Sign    Worried About Running Out of Food in the Last Year: Never true    Ran Out of Food in the Last Year: Never true  Transportation Needs: No Transportation Needs (01/06/2024)   PRAPARE - Administrator, Civil Service (Medical): No    Lack of Transportation (Non-Medical): No  Physical  Activity: Sufficiently Active (01/06/2024)   Exercise Vital Sign    Days of Exercise per Week: 4 days    Minutes of Exercise per Session: 60 min  Stress: No Stress Concern Present (01/06/2024)   [employer] of Occupational Health - Occupational Stress Questionnaire    Feeling of Stress : Not at all  Social Connections: Socially Integrated (01/06/2024)   Social Connection and Isolation Panel [NHANES]    Frequency of Communication with Friends and Family: More than three times a week    Frequency of Social Gatherings with Friends and Family: More than three times a week    Attends Religious Services: More than 4 times per year    Active Member of [employer] or Organizations: Yes    Attends Engineer, structural: More than 4 times per year    Marital Status: Married    Tobacco Counseling Counseling given: No    Clinical Intake:  Pre-visit preparation completed: Yes  Pain : No/denies pain     BMI - recorded: 22.68 Nutritional Status: BMI of 19-24  Normal Nutritional Risks: None Diabetes: No  Lab Results  Component Value Date   HGBA1C 5.6 10/03/2020   HGBA1C 5.6 07/25/2019   HGBA1C 5.6 12/17/2015     How often do you need to have someone help you when you read instructions, pamphlets, or other written materials from your doctor or pharmacy?: 1 - Never  Interpreter Needed?: No  Information entered by :: Kandy Orris, CMA   Activities of Daily Living     01/06/2024    1:14 PM  In your present state of health, do you have any difficulty performing the following activities:  Hearing? 0  Vision? 0  Difficulty concentrating or making decisions? 0  Walking or climbing stairs? 0  Dressing or bathing? 0  Doing errands, shopping? 0  Preparing Food and eating ? N  Using the Toilet? N  In the past six months, have you accidently leaked urine? N  Do you have problems with loss of bowel control? N  Managing your Medications? N  Managing your Finances? N   Housekeeping or managing your Housekeeping? N    Patient Care Team: Arcadio Knuckles, MD as PCP - General (Internal Medicine) Pa, [facility] as Consulting Physician (Optometry) Sammye Cristal, MD as Consulting Physician (Orthopedic Surgery) Algie Ingle, OD (Ophthalmology) Dyanna Glasgow, DO as Consulting Physician (Obstetrics and Gynecology)  Indicate any recent Medical Services you may have received from other than [facility] providers in the past year (date may be approximate).     Assessment:   This is a routine wellness examination for Alyxis.  Hearing/Vision screen Hearing Screening -  Comments:: Denies hearing difficulties  - wears hearing aids Vision Screening - Comments:: Wears rx glasses - up to date with routine eye exams with Dr Rudolpho Sevin    Goals Addressed               This Visit's Progress     Patient Stated (pt-stated)        Patient stated she plans to continue exercising        Depression Screen     01/06/2024    1:17 PM 09/11/2022    2:44 PM 04/10/2022    3:23 PM 01/17/2022   10:49 AM 11/14/2021    4:49 PM 11/05/2020    2:55 PM 08/10/2019    4:42 PM  PHQ 2/9 Scores  PHQ - 2 Score 0 0 0 0 0 0 0  PHQ- 9 Score 2          Fall Risk     01/06/2024    1:14 PM 09/11/2022    2:39 PM 04/10/2022    3:22 PM 01/17/2022   10:49 AM  Fall Risk   Falls in the past year? 0 0 1 0  Number falls in past yr: 0 0 0 0  Injury with Fall? 0 0 1 0  Risk for fall due to : No Fall Risks No Fall Risks  No Fall Risks  Follow up Falls prevention discussed;Falls evaluation completed Falls prevention discussed  Falls evaluation completed    MEDICARE RISK AT HOME:  Medicare Risk at Home Any stairs in or around the home?: No If so, are there any without handrails?: No Home free of loose throw rugs in walkways, pet beds, electrical cords, etc?: Yes Adequate lighting in your home to reduce risk of falls?: Yes Life alert?: No Use of a cane, walker or w/c?:  No Grab bars in the bathroom?: Yes Shower chair or bench in shower?: No Elevated toilet seat or a handicapped toilet?: No  TIMED UP AND GO:  Was the test performed?  No  Cognitive Function: 6CIT completed      03/18/2023   10:40 AM  Montreal Cognitive Assessment   Visuospatial/ Executive (0/5) 5  Naming (0/3) 3  Attention: Read list of digits (0/2) 2  Attention: Read list of letters (0/1) 1  Attention: Serial 7 subtraction starting at 100 (0/3) 3  Language: Repeat phrase (0/2) 2  Language : Fluency (0/1) 1  Abstraction (0/2) 2  Delayed Recall (0/5) 3  Orientation (0/6) 6  Total 28      01/06/2024    1:18 PM 09/11/2022    2:42 PM  6CIT Screen  What Year? 0 points 0 points  What month? 0 points 0 points  What time? 0 points 0 points  Count back from 20 0 points 0 points  Months in reverse 0 points 0 points  Repeat phrase 2 points 0 points  Total Score 2 points 0 points    Immunizations Immunization History  Administered Date(s) Administered   Fluad Quad(high Dose 65+) 09/02/2021   Influenza, High Dose Seasonal PF 08/25/2022   Influenza,inj,Quad PF,6+ Mos 06/22/2018, 07/23/2020   Influenza-Unspecified 06/20/2019   PFIZER Comirnaty(Gray Top)Covid-19 Tri-Sucrose Vaccine 01/29/2021   PFIZER(Purple Top)SARS-COV-2 Vaccination 11/19/2019, 12/10/2019   Tdap 04/23/2014   Zoster Recombinant(Shingrix) 10/03/2020, 12/03/2020    Screening Tests Health Maintenance  Topic Date Due   COVID-19 Vaccine (4 - 2024-25 season) 05/24/2023   Pneumonia Vaccine 77+ Years old (1 of 1 - PCV) 01/05/2025 (Originally 01/13/2007)   INFLUENZA  VACCINE  04/22/2024   DTaP/Tdap/Td (2 - Td or Tdap) 04/23/2024   MAMMOGRAM  05/07/2024   Medicare Annual Wellness (AWV)  01/05/2025   Fecal DNA (Cologuard)  09/10/2025   DEXA SCAN  Completed   Hepatitis C Screening  Completed   Zoster Vaccines- Shingrix  Completed   HPV VACCINES  Aged Out   Meningococcal B Vaccine  Aged Out   Colonoscopy   Discontinued    Health Maintenance  Health Maintenance Due  Topic Date Due   COVID-19 Vaccine (4 - 2024-25 season) 05/24/2023   Health Maintenance Items Addressed: 01/06/2024  Mammogram scheduled by Bedelia Bowen (sent letter requesting copy of 2024 Mammogram report)  Cologuard status: Completed in 08/2022 (Negative) and due in 3 years.  Additional Screening:  Vision Screening: Recommended annual ophthalmology exams for early detection of glaucoma and other disorders of the eye.  Dental Screening: Recommended annual dental exams for proper oral hygiene  Community Resource Referral / Chronic Care Management: CRR required this visit?  No   CCM required this visit?  No     Plan:     I have personally reviewed and noted the following in the patient's chart:   Medical and social history Use of alcohol, tobacco or illicit drugs  Current medications and supplements including opioid prescriptions. Patient is not currently taking opioid prescriptions. Functional ability and status Nutritional status Physical activity Advanced directives List of other physicians Hospitalizations, surgeries, and ER visits in previous 12 months Vitals Screenings to include cognitive, depression, and falls Referrals and appointments  In addition, I have reviewed and discussed with patient certain preventive protocols, quality metrics, and best practice recommendations. A written personalized care plan for preventive services as well as general preventive health recommendations were provided to patient.     Patria Bookbinder, CMA   01/06/2024   After Visit Summary: (In Person-Declined) Patient declined AVS at this time.  Notes: Nothing significant to report at this time.

## 2024-01-07 DIAGNOSIS — H269 Unspecified cataract: Secondary | ICD-10-CM | POA: Diagnosis not present

## 2024-01-07 DIAGNOSIS — Z0100 Encounter for examination of eyes and vision without abnormal findings: Secondary | ICD-10-CM | POA: Diagnosis not present

## 2024-01-14 DIAGNOSIS — M25561 Pain in right knee: Secondary | ICD-10-CM | POA: Diagnosis not present

## 2024-01-14 DIAGNOSIS — M25661 Stiffness of right knee, not elsewhere classified: Secondary | ICD-10-CM | POA: Diagnosis not present

## 2024-03-10 ENCOUNTER — Encounter: Payer: Self-pay | Admitting: Neurology

## 2024-03-10 ENCOUNTER — Ambulatory Visit: Payer: Medicare PPO | Admitting: Neurology

## 2024-03-10 VITALS — BP 155/85 | HR 74 | Ht 59.0 in | Wt 127.0 lb

## 2024-03-10 DIAGNOSIS — R413 Other amnesia: Secondary | ICD-10-CM

## 2024-03-10 NOTE — Progress Notes (Signed)
 [facility]  PATIENT: Beth Aguilar DOB: 1956/08/19  REFERRING DOCTOR OR PCP: Sandra Crouch, MD SOURCE: Patient, notes from primary care, imaging and lab reports, MRI images personally reviewed  _________________________________   HISTORICAL  CHIEF COMPLAINT:  Chief Complaint  Patient presents with   RM10/MEMORY    Pt is here with her Husband. Pt states that things have been about the same with her memory. Pt's husband states that he hasn't noticed any change.     HISTORY OF PRESENT ILLNESS:  Beth Aguilar is a 68 y.o. woman with memory loss.    Update 03/10/2024: She has noted mild reduce memory and cognitive function including word finding difficulty since 2022.  We discussed that this appears to be subjective as she scored 29/30 on the [facility] cognitive assessment today.  From the initial examination she gave some examples:she is trying to write some stories and she has difficulty remembering details from the past (I.e. school name).  Word finding issues are sometimes names and other times regular words.   She feels she needs to think longer before she speaks.     She continues to handle the finances.  Of note, her husband does not note any significant issues with her cognition  She denies depression or anxiety.   Sleep is variable and much worse if she has caffeine.   She falls asleep easily but wakes up about 4 times, looks at clock and then falls back asleep easily.  I recommended that she try to get 7 hours of sleep   We discussed trying to increase physical, cognitive and social activity       03/10/2024   10:04 AM 03/18/2023   10:40 AM  Montreal Cognitive Assessment   Visuospatial/ Executive (0/5) 5 5  Naming (0/3) 3 3  Attention: Read list of digits (0/2) 2 2  Attention: Read list of letters (0/1) 1 1  Attention: Serial 7 subtraction starting at 100 (0/3) 3 3  Language: Repeat phrase (0/2) 2 2  Language : Fluency (0/1) 1 1  Abstraction (0/2) 2  2  Delayed Recall (0/5) 4 3  Orientation (0/6) 6 6  Total 29 28  Adjusted Score (based on education) 29     Data Imaging: MRI of the brain 08/30/2021 did not show generalized cortical atrophy   There are scattered T2/FLAIR hyperintense foci consistent with mild chronic microvascular ischemic change.  There were no comparison films.  Labs 09/01/2022 showed normal vitamin D  and TSH.  Lipid panel showed elevated triglycerides and VLDL  REVIEW OF SYSTEMS: Constitutional: No fevers, chills, sweats, or change in appetite Eyes: No visual changes, double vision, eye pain Ear, nose and throat: No hearing loss, ear pain, nasal congestion, sore throat Cardiovascular: No chest pain, palpitations Respiratory:  No shortness of breath at rest or with exertion.   No wheezes GastrointestinaI: No nausea, vomiting, diarrhea, abdominal pain, fecal incontinence Genitourinary:  No dysuria, urinary retention or frequency.  No nocturia. Musculoskeletal:  No neck pain, back pain Integumentary: No rash, pruritus, skin lesions Neurological: as above Psychiatric: No depression at this time.  No anxiety Endocrine: No palpitations, diaphoresis, change in appetite, change in weigh or increased thirst Hematologic/Lymphatic:  No anemia, purpura, petechiae. Allergic/Immunologic: No itchy/runny eyes, nasal congestion, recent allergic reactions, rashes  ALLERGIES: Allergies  Allergen Reactions   Flonase [Fluticasone Propionate] Other (See Comments)    hearing   Compazine [Prochlorperazine]    Gadolinium Derivatives    Guaifenesin & Derivatives     Felt  drunk   Macadamia Nut Oil     Also peanuts and almonds   Pistachio Nut (Diagnostic)    Sulfa Antibiotics     HOME MEDICATIONS:  Current Outpatient Medications:    aspirin  81 MG tablet, Take 1 tablet (81 mg total) by mouth daily., Disp: 100 tablet, Rfl: 99   CALCIUM  CITRATE PO, 1 TABLET PO DAILY, Disp: , Rfl:    Cholecalciferol (VITAMIN D3 PO), 1 CAPSULE PO  DAILY, Disp: , Rfl:    naproxen (NAPROSYN) 500 MG tablet, Take 500 mg by mouth 2 (two) times daily with a meal. As needed - [facility], Disp: , Rfl:    Probiotic Product (PROBIOTIC PO), 1 CAPSULE PO DAILY, Disp: , Rfl:    rosuvastatin  (CRESTOR ) 10 MG tablet, TAKE 1 TABLET (10 MG TOTAL) BY MOUTH DAILY., Disp: 90 tablet, Rfl: 3   triamcinolone  cream (KENALOG ) 0.1 %, Apply 1 application. topically 2 (two) times daily., Disp: 30 g, Rfl: 0   acetaminophen (TYLENOL) 325 MG tablet, Take 650 mg by mouth at bedtime. (Patient not taking: Reported on 03/10/2024), Disp: , Rfl:    EPINEPHrine  0.3 mg/0.3 mL IJ SOAJ injection, Inject 0.3 mg into the muscle as needed for anaphylaxis. (Patient not taking: Reported on 03/10/2024), Disp: 1 each, Rfl: 1   Melatonin 1 MG CAPS, Take 1 capsule by mouth at bedtime. As needed (Patient not taking: Reported on 03/10/2024), Disp: , Rfl:   PAST MEDICAL HISTORY: Past Medical History:  Diagnosis Date   Anal fissure    Arthritis    Hearing loss    HLD (hyperlipidemia)    HTN (hypertension)    Kidney stone    Urticaria     PAST SURGICAL HISTORY: Past Surgical History:  Procedure Laterality Date   CESAREAN SECTION     x2   Colorectal Surgery     x 2, 1982 and 1991   ROTATOR CUFF REPAIR Left    TONSILLECTOMY AND ADENOIDECTOMY  1965    FAMILY HISTORY: Family History  Problem Relation Age of Onset   Depression Mother    Arthritis Mother    Heart disease Mother    Hypertension Mother    Hyperlipidemia Mother    Stroke Father    Pancreatic cancer Maternal Uncle    Pancreatic cancer Cousin        uncles daughter    SOCIAL HISTORY: Social History   Socioeconomic History   Marital status: Married    Spouse name: Not on file   Number of children: 2   Years of education: Not on file   Highest education level: Bachelor's degree (e.g., BA, AB, BS)  Occupational History   Occupation: Teacher  Tobacco Use   Smoking status: Never    Passive  exposure: Never   Smokeless tobacco: Never  Vaping Use   Vaping status: Never Used  Substance and Sexual Activity   Alcohol use: No   Drug use: No   Sexual activity: Yes    Partners: Male  Other Topics Concern   Not on file  Social History Narrative   Right handed   Wear glasses   Drinks 1 oz of coffee daily    Social Drivers of Health   Financial Resource Strain: Low Risk  (01/06/2024)   Overall Financial Resource Strain (CARDIA)    Difficulty of Paying Living Expenses: Not hard at all  Food Insecurity: No Food Insecurity (01/06/2024)   Hunger Vital Sign    Worried About Running Out of Food in the Last  Year: Never true    Ran Out of Food in the Last Year: Never true  Transportation Needs: No Transportation Needs (01/06/2024)   PRAPARE - Administrator, Civil Service (Medical): No    Lack of Transportation (Non-Medical): No  Physical Activity: Sufficiently Active (01/06/2024)   Exercise Vital Sign    Days of Exercise per Week: 4 days    Minutes of Exercise per Session: 60 min  Stress: No Stress Concern Present (01/06/2024)   [employer] of Occupational Health - Occupational Stress Questionnaire    Feeling of Stress : Not at all  Social Connections: Socially Integrated (01/06/2024)   Social Connection and Isolation Panel    Frequency of Communication with Friends and Family: More than three times a week    Frequency of Social Gatherings with Friends and Family: More than three times a week    Attends Religious Services: More than 4 times per year    Active Member of [employer] or Organizations: Yes    Attends Banker Meetings: More than 4 times per year    Marital Status: Married  Catering manager Violence: Not At Risk (01/06/2024)   Humiliation, Afraid, Rape, and Kick questionnaire    Fear of Current or Ex-Partner: No    Emotionally Abused: No    Physically Abused: No    Sexually Abused: No       PHYSICAL EXAM  Vitals:   03/10/24 1000  BP:  (!) 155/85  Pulse: 74  SpO2: 97%  Weight: 127 lb (57.6 kg)  Height: 4' 11 (1.499 m)     Body mass index is 25.65 kg/m.   General: The patient is well-developed and well-nourished and in no acute distress  HEENT:  Head is [place]/AT.  Sclera are anicteric.     Skin: Extremities are without rash or  edema.   Neurologic Exam  Mental status: The patient is alert and oriented x 3 at the time of the examination. The patient has apparent normal recent and remote memory, with an apparently normal attention span and concentration ability.   Speech is normal.  She scored 29/30 on the [facility] cognitive.  The single point she will as well as on delayed recall  Cranial nerves: Extraocular movements are full. .  There is good facial sensation to soft touch bilaterally.Facial strength is normal.  Trapezius and sternocleidomastoid strength is normal. No dysarthria is noted.   No obvious hearing deficits are noted.  Motor:  Muscle bulk is normal.   Tone is normal. Strength is  5 / 5 in all 4 extremities.   Sensory: Sensory testing is intact to pinprick, soft touch and vibration sensation in arms but reduced vibration in feet  Coordination: Cerebellar testing reveals good finger-nose-finger and heel-to-shin bilaterally.  Gait and station: Station is normal.   Gait is normal. Tandem gait is mildly wide. Romberg is negative.       DIAGNOSTIC DATA (LABS, IMAGING, TESTING) - I reviewed patient records, labs, notes, testing and imaging myself where available.  Lab Results  Component Value Date   WBC 10.7 (H) 08/04/2023   HGB 14.6 08/04/2023   HCT 42.5 08/04/2023   MCV 91.8 08/04/2023   PLT 228.0 08/04/2023      Component Value Date/Time   NA 139 08/04/2023 1401   K 3.7 08/04/2023 1401   CL 101 08/04/2023 1401   CO2 28 08/04/2023 1401   GLUCOSE 108 (H) 08/04/2023 1401   BUN 17 08/04/2023 1401   CREATININE 0.95  08/04/2023 1401   CREATININE 0.92 05/04/2020 1133   CALCIUM  9.8 08/04/2023  1401   PROT 7.5 08/04/2023 1401   ALBUMIN 4.7 08/04/2023 1401   AST 20 08/04/2023 1401   ALT 17 08/04/2023 1401   ALKPHOS 58 08/04/2023 1401   BILITOT 0.5 08/04/2023 1401   Lab Results  Component Value Date   CHOL 185 08/04/2023   HDL 50.80 08/04/2023   LDLCALC 77 08/04/2023   LDLDIRECT 99.0 09/01/2022   TRIG 286.0 (H) 08/04/2023   CHOLHDL 4 08/04/2023   Lab Results  Component Value Date   HGBA1C 5.6 10/03/2020   Lab Results  Component Value Date   VITAMINB12 395 03/18/2023   Lab Results  Component Value Date   TSH 1.69 09/01/2022       ASSESSMENT AND PLAN  No diagnosis found.   1.  She has a subjective memory loss but scored quite well on the [facility] cognitive assessment-29/30.  I reviewed the MRI of the brain from 2022 in her presence.  She does have some chronic microvascular ischemic changes and minimal generalized cortical atrophy but no large vessel strokes or other major change.  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  However, it is clearly present and appears normal.  She also was told that the back of her brain is too low.  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 2.  We had a long discussion about brain health and aging.  It is important for her to try to get 7 hours of sleep and also to eat well.  A heart healthy diet is also brain healthy.  We went over some examples.  Additionally I discussed the importance of exercise, cognitive activity and social activity. 3.  As she is stable I will see her as needed if she notes any major change in the future.   This visit is part of a comprehensive longitudinal care medical relationship regarding the patients primary diagnosis of memory loss and related concerns.      Davier Tramell A. Godwin Lat, MD, [facility] 03/10/2024, 10:13 AM Certified in Neurology, Clinical Neurophysiology, Sleep Medicine and Neuroimaging  [facility] [address]  [address] [place], [place] [address] [phone]

## 2024-03-24 ENCOUNTER — Ambulatory Visit (INDEPENDENT_AMBULATORY_CARE_PROVIDER_SITE_OTHER): Admitting: Internal Medicine

## 2024-03-24 ENCOUNTER — Ambulatory Visit: Payer: Self-pay | Admitting: Internal Medicine

## 2024-03-24 ENCOUNTER — Other Ambulatory Visit

## 2024-03-24 VITALS — BP 134/88 | HR 72 | Temp 98.6°F | Resp 16 | Ht 59.0 in | Wt 127.0 lb

## 2024-03-24 DIAGNOSIS — Z23 Encounter for immunization: Secondary | ICD-10-CM | POA: Insufficient documentation

## 2024-03-24 DIAGNOSIS — K2101 Gastro-esophageal reflux disease with esophagitis, with bleeding: Secondary | ICD-10-CM

## 2024-03-24 DIAGNOSIS — I10 Essential (primary) hypertension: Secondary | ICD-10-CM

## 2024-03-24 DIAGNOSIS — E785 Hyperlipidemia, unspecified: Secondary | ICD-10-CM

## 2024-03-24 DIAGNOSIS — Z0000 Encounter for general adult medical examination without abnormal findings: Secondary | ICD-10-CM

## 2024-03-24 DIAGNOSIS — Z0001 Encounter for general adult medical examination with abnormal findings: Secondary | ICD-10-CM | POA: Insufficient documentation

## 2024-03-24 LAB — BASIC METABOLIC PANEL WITH GFR
BUN: 18 mg/dL (ref 6–23)
CO2: 29 meq/L (ref 19–32)
Calcium: 9.6 mg/dL (ref 8.4–10.5)
Chloride: 99 meq/L (ref 96–112)
Creatinine, Ser: 0.81 mg/dL (ref 0.40–1.20)
GFR: 75.23 mL/min (ref >60.00)
Glucose, Bld: 95 mg/dL (ref 70–99)
Potassium: 4.1 meq/L (ref 3.5–5.1)
Sodium: 137 meq/L (ref 135–145)

## 2024-03-24 LAB — CBC WITH DIFFERENTIAL/PLATELET
Basophils Absolute: 0.1 10*3/uL (ref 0.0–0.1)
Basophils Relative: 0.9 % (ref 0.0–3.0)
Eosinophils Absolute: 0.2 10*3/uL (ref 0.0–0.7)
Eosinophils Relative: 3.2 % (ref 0.0–5.0)
HCT: 41.2 % (ref 36.0–46.0)
Hemoglobin: 14 g/dL (ref 12.0–15.0)
Lymphocytes Relative: 27.9 % (ref 12.0–46.0)
Lymphs Abs: 2 10*3/uL (ref 0.7–4.0)
MCHC: 34.1 g/dL (ref 30.0–36.0)
MCV: 90.8 fl (ref 78.0–100.0)
Monocytes Absolute: 0.8 10*3/uL (ref 0.1–1.0)
Monocytes Relative: 10.7 % (ref 3.0–12.0)
Neutro Abs: 4.1 10*3/uL (ref 1.4–7.7)
Neutrophils Relative %: 57.3 % (ref 43.0–77.0)
Platelets: 191 10*3/uL (ref 150.0–400.0)
RBC: 4.53 Mil/uL (ref 3.87–5.11)
RDW: 13 % (ref 11.5–15.5)
WBC: 7.2 10*3/uL (ref 4.0–10.5)

## 2024-03-24 LAB — TSH: TSH: 1.53 u[IU]/mL (ref 0.35–5.50)

## 2024-03-24 MED ORDER — BOOSTRIX 5-2.5-18.5 LF-MCG/0.5 IM SUSP: 0.5000 mL | Freq: Once | INTRAMUSCULAR | 0 refills | Status: AC

## 2024-03-24 NOTE — Patient Instructions (Signed)
 PLEASE GO TO [address] for labs today Check-in in the basement    Health Maintenance, Female Adopting a healthy lifestyle and getting preventive care are important in promoting health and wellness. Ask your health care provider about: The right schedule for you to have regular tests and exams. Things you can do on your own to prevent diseases and keep yourself healthy. What should I know about diet, weight, and exercise? Eat a healthy diet  Eat a diet that includes plenty of vegetables, fruits, low-fat dairy products, and lean protein. Do not eat a lot of foods that are high in solid fats, added sugars, or sodium. Maintain a healthy weight Body mass index (BMI) is used to identify weight problems. It estimates body fat based on height and weight. Your health care provider can help determine your BMI and help you achieve or maintain a healthy weight. Get regular exercise Get regular exercise. This is one of the most important things you can do for your health. Most adults should: Exercise for at least 150 minutes each week. The exercise should increase your heart rate and make you sweat (moderate-intensity exercise). Do strengthening exercises at least twice a week. This is in addition to the moderate-intensity exercise. Spend less time sitting. Even light physical activity can be beneficial. Watch cholesterol and blood lipids Have your blood tested for lipids and cholesterol at 67 years of age, then have this test every 5 years. Have your cholesterol levels checked more often if: Your lipid or cholesterol levels are high. You are older than 67 years of age. You are at high risk for heart disease. What should I know about cancer screening? Depending on your health history and family history, you may need to have cancer screening at various ages. This may include screening for: Breast cancer. Cervical cancer. Colorectal cancer. Skin cancer. Lung cancer. What should I know about  heart disease, diabetes, and high blood pressure? Blood pressure and heart disease High blood pressure causes heart disease and increases the risk of stroke. This is more likely to develop in people who have high blood pressure readings or are overweight. Have your blood pressure checked: Every 3-5 years if you are 14-11 years of age. Every year if you are 46 years old or older. Diabetes Have regular diabetes screenings. This checks your fasting blood sugar level. Have the screening done: Once every three years after age 72 if you are at a normal weight and have a low risk for diabetes. More often and at a younger age if you are overweight or have a high risk for diabetes. What should I know about preventing infection? Hepatitis B If you have a higher risk for hepatitis B, you should be screened for this virus. Talk with your health care provider to find out if you are at risk for hepatitis B infection. Hepatitis C Testing is recommended for: Everyone born from [birth year] through 1964. Anyone with known risk factors for hepatitis C. Sexually transmitted infections (STIs) Get screened for STIs, including gonorrhea and chlamydia, if: You are sexually active and are younger than 67 years of age. You are older than 67 years of age and your health care provider tells you that you are at risk for this type of infection. Your sexual activity has changed since you were last screened, and you are at increased risk for chlamydia or gonorrhea. Ask your health care provider if you are at risk. Ask your health care provider about whether you are at  high risk for HIV. Your health care provider may recommend a prescription medicine to help prevent HIV infection. If you choose to take medicine to prevent HIV, you should first get tested for HIV. You should then be tested every 3 months for as long as you are taking the medicine. Pregnancy If you are about to stop having your period (premenopausal) and you may  become pregnant, seek counseling before you get pregnant. Take 400 to 800 micrograms (mcg) of folic acid  every day if you become pregnant. Ask for birth control (contraception) if you want to prevent pregnancy. Osteoporosis and menopause Osteoporosis is a disease in which the bones lose minerals and strength with aging. This can result in bone fractures. If you are 31 years old or older, or if you are at risk for osteoporosis and fractures, ask your health care provider if you should: Be screened for bone loss. Take a calcium  or vitamin D  supplement to lower your risk of fractures. Be given hormone replacement therapy (HRT) to treat symptoms of menopause. Follow these instructions at home: Alcohol use Do not drink alcohol if: Your health care provider tells you not to drink. You are pregnant, may be pregnant, or are planning to become pregnant. If you drink alcohol: Limit how much you have to: 0-1 drink a day. Know how much alcohol is in your drink. In the [place] one drink equals one 12 oz bottle of beer (355 mL), one 5 oz glass of wine (148 mL), or one 1 oz glass of hard liquor (44 mL). Lifestyle Do not use any products that contain nicotine or tobacco. These products include cigarettes, chewing tobacco, and vaping devices, such as e-cigarettes. If you need help quitting, ask your health care provider. Do not use street drugs. Do not share needles. Ask your health care provider for help if you need support or information about quitting drugs. General instructions Schedule regular health, dental, and eye exams. Stay current with your vaccines. Tell your health care provider if: You often feel depressed. You have ever been abused or do not feel safe at home. Summary Adopting a healthy lifestyle and getting preventive care are important in promoting health and wellness. Follow your health care provider's instructions about healthy diet, exercising, and getting tested or screened for  diseases. Follow your health care provider's instructions on monitoring your cholesterol and blood pressure. This information is not intended to replace advice given to you by your health care provider. Make sure you discuss any questions you have with your health care provider. Document Revised: 01/28/2021 Document Reviewed: 01/28/2021 Elsevier Patient Education  2024 [employer].

## 2024-03-24 NOTE — Progress Notes (Signed)
 Subjective:  Patient ID: Beth Aguilar, female    DOB: 1956/03/02  Age: 68 y.o. MRN: [id number]  CC: Annual Exam, Hypertension, and Hyperlipidemia   HPI Beth Aguilar presents for a CPX and f/up ----  Discussed the use of AI scribe software for clinical note transcription with the patient, who gave verbal consent to proceed.  History of Present Illness   Beth Aguilar is a 68 year old female who presents for routine follow-up and medication review.  She remains active but often experiences a sensation of feeling 'bruised' after physical activity. No associated chest pain, shortness of breath, dizziness, or lightheadedness during these activities.  Her last mammogram was in the summer of 2024 at the [facility]. She has not scheduled her next mammogram yet and has no concerns about breast changes.  She takes rosuvastatin  regularly and has been on it for years without experiencing any side effects. She questions the necessity of continuing the medication.  She does not monitor her blood pressure at home, noting variability in readings during office visits. Her last blood work was conducted in November 2024.       Outpatient Medications Prior to Visit  Medication Sig Dispense Refill   aspirin  81 MG tablet Take 1 tablet (81 mg total) by mouth daily. 100 tablet 99   CALCIUM  CITRATE PO 1 TABLET PO DAILY     Cholecalciferol (VITAMIN D3 PO) 1 CAPSULE PO DAILY     EPINEPHrine  0.3 mg/0.3 mL IJ SOAJ injection Inject 0.3 mg into the muscle as needed for anaphylaxis. 1 each 1   naproxen (NAPROSYN) 500 MG tablet Take 500 mg by mouth 2 (two) times daily with a meal. As needed - [facility]     Probiotic Product (PROBIOTIC PO) 1 CAPSULE PO DAILY     rosuvastatin  (CRESTOR ) 10 MG tablet TAKE 1 TABLET (10 MG TOTAL) BY MOUTH DAILY. 90 tablet 3   acetaminophen (TYLENOL) 325 MG tablet Take 650 mg by mouth at bedtime. (Patient not taking: Reported on 03/10/2024)      Melatonin 1 MG CAPS Take 1 capsule by mouth at bedtime. As needed (Patient not taking: Reported on 03/10/2024)     triamcinolone  cream (KENALOG ) 0.1 % Apply 1 application. topically 2 (two) times daily. 30 g 0   No facility-administered medications prior to visit.    ROS Review of Systems  Constitutional: Negative.  Negative for diaphoresis and fatigue.  HENT: Negative.    Respiratory: Negative.  Negative for cough, chest tightness, shortness of breath and wheezing.   Cardiovascular:  Negative for chest pain, palpitations and leg swelling.  Gastrointestinal: Negative.  Negative for abdominal pain, constipation, diarrhea, nausea and vomiting.  Genitourinary: Negative.  Negative for difficulty urinating.  Musculoskeletal: Negative.  Negative for arthralgias and myalgias.  Skin: Negative.   Neurological:  Negative for dizziness, weakness and light-headedness.  Hematological:  Negative for adenopathy. Does not bruise/bleed easily.  Psychiatric/Behavioral: Negative.      Objective:  BP 134/88 (BP Location: Left Arm, Patient Position: Sitting, Cuff Size: Normal)   Pulse 72   Temp 98.6 F (37 C) (Temporal)   Resp 16   Ht 4' 11 (1.499 m)   Wt 127 lb (57.6 kg)   SpO2 96%   BMI 25.65 kg/m   BP Readings from Last 3 Encounters:  03/24/24 134/88  03/10/24 (!) 155/85  01/06/24 118/62    Wt Readings from Last 3 Encounters:  03/24/24 127 lb (57.6 kg)  03/10/24 127 lb (57.6  kg)  01/06/24 126 lb (57.2 kg)    Physical Exam Vitals reviewed.  Constitutional:      Appearance: Normal appearance.  HENT:     Mouth/Throat:     Mouth: Mucous membranes are moist.  Eyes:     General: No scleral icterus.    Conjunctiva/sclera: Conjunctivae normal.  Cardiovascular:     Rate and Rhythm: Normal rate and regular rhythm.     Heart sounds: No murmur heard.    No friction rub. No gallop.     Comments: EKG--- NSR, 70 bpm No LVH, Q waves, or ST/T wave changes  Unchanged  Pulmonary:      Effort: Pulmonary effort is normal.     Breath sounds: No stridor. No wheezing, rhonchi or rales.  Abdominal:     General: Abdomen is flat.     Palpations: There is no mass.     Tenderness: There is no abdominal tenderness. There is no guarding.     Hernia: No hernia is present.  Musculoskeletal:        General: Normal range of motion.     Cervical back: Neck supple.     Right lower leg: No edema.     Left lower leg: No edema.  Lymphadenopathy:     Cervical: No cervical adenopathy.  Skin:    General: Skin is warm and dry.     Coloration: Skin is not pale.     Findings: No rash.  Neurological:     General: No focal deficit present.     Mental Status: She is alert and oriented to person, place, and time. Mental status is at baseline.  Psychiatric:        Mood and Affect: Mood normal.        Behavior: Behavior normal.        Thought Content: Thought content normal.        Judgment: Judgment normal.     Lab Results  Component Value Date   WBC 7.2 03/24/2024   HGB 14.0 03/24/2024   HCT 41.2 03/24/2024   PLT 191.0 03/24/2024   GLUCOSE 95 03/24/2024   CHOL 185 08/04/2023   TRIG 286.0 (H) 08/04/2023   HDL 50.80 08/04/2023   LDLDIRECT 99.0 09/01/2022   LDLCALC 77 08/04/2023   ALT 17 08/04/2023   AST 20 08/04/2023   NA 137 03/24/2024   K 4.1 03/24/2024   CL 99 03/24/2024   CREATININE 0.81 03/24/2024   BUN 18 03/24/2024   CO2 29 03/24/2024   TSH 1.53 03/24/2024   INR 1.0 12/22/2022   HGBA1C 5.6 10/03/2020    DG BONE DENSITY (DXA) Result Date: 02/20/2023 EXAM: DUAL X-RAY ABSORPTIOMETRY (DXA) FOR BONE MINERAL DENSITY IMPRESSION: Referring Physician:  DEBBY LITTIE MOLT Your patient completed a bone mineral density test using GE Lunar iDXA system (analysis version: 16). Technologist:    lmn PATIENT: Name: Beth Aguilar Patient ID: [id number] Birth Date: [birth year] Height: 59.6 in. Sex: Female Measured: 02/20/2023 Weight: 124.4 lbs. Indications: Caucasian, Estrogen Deficient,  Family Hist. (Parent hip fracture) Fractures: NONE Treatments: Calcium  (E943.0), Vitamin D  (E933.5) ASSESSMENT: The BMD measured at AP Spine L1-L2 is 0.894 g/cm2 with a T-score of -2.3. This patient is considered osteopenic/low bone mass according to World Health Organization [facility]) criteria. The quality of the exam is good. L3 and L4 were excluded due to degenerative changes. Site Region Measured Date Measured Age YA BMD Significant CHANGE T-score AP Spine  L1-L2      02/20/2023    66.1         -  2.3    0.894 g/cm2 DualFemur Neck Left  02/20/2023    66.1         -2.1    0.744 g/cm2 DualFemur Neck Left  02/18/2013    56.1         -2.0    0.759 g/cm2 DualFemur Total Mean 3/20/2023    66.1         -0.9    0.898 g/cm2 World Health Organization [facility]) criteria for post-menopausal, Caucasian Women: Normal       T-score at or above -1 SD Osteopenia   T-score between -1 and -2.5 SD Osteoporosis T-score at or below -2.5 SD RECOMMENDATION: 1. All patients should optimize calcium  and vitamin D  intake. 2. Consider FDA-approved medical therapies in postmenopausal women and men aged 68 years and older, based on the following: a. A hip or vertebral (clinical or morphometric) fracture. b. T-score = -2.5 at the femoral neck or spine after appropriate evaluation to exclude secondary causes. c. Low bone mass (T-score between -1.0 and -2.5 at the femoral neck or spine) and a 10-year probability of a hip fracture = 3% or a 10-year probability of a major osteoporosis-related fracture = 20% based on the [place] -adapted WHO algorithm. d. Clinician judgment and/or patient preferences may indicate treatment for people with 10-year fracture probabilities above or below these levels. FOLLOW-UP: Patients with diagnosis of osteoporosis or at high risk for fracture should have regular bone mineral density tests. Patients eligible for Medicare are allowed routine testing every 2 years. The testing frequency can be increased to one year for patients who  have rapidly progressing disease, are receiving or discontinuing medical therapy to restore bone mass, or have additional risk factors. I have reviewed this study and agree with the findings. [facility] FRAX* 10-year Probability of Fracture Based on femoral neck BMD: DualFemur (Left) Major Osteoporotic Fracture: 20.6% Hip Fracture:                2.5% Population:                  [place]  (Caucasian) Risk Factors:                Family Hist. (Parent hip fracture) *FRAX is a Armed forces logistics/support/administrative officer of the [employer] of [employer] for Metabolic Bone Disease, a World Science writer (WHO) [employer]. ASSESSMENT: The probability of a major osteoporotic fracture is 20.6% within the next ten years. The probability of a hip fracture is 2.5% within the next ten years. Electronically Signed   By: Rosaline Collet M.D.   On: 02/20/2023 08:02    Assessment & Plan:  Dyslipidemia, goal LDL below 100- LDL goal achieved. Doing well on the statin   Gastroesophageal reflux disease with esophagitis and hemorrhage -     CBC with Differential/Platelet; Future  Primary hypertension- BP is well controlled. EKG is negative for LVH. -     Basic metabolic panel with GFR; Future -     CBC with Differential/Platelet; Future -     TSH; Future -     Urinalysis, Routine w reflex microscopic; Future -     EKG 12-Lead  Encounter for general adult medical examination with abnormal findings- Exam completed, labs reviewed, vaccines reviewed, cancer screenings are UTD, pt ed material was given.   Need for prophylactic vaccination with combined diphtheria-tetanus-pertussis (DTP) vaccine -     Boostrix; Inject 0.5 mLs into the muscle once for 1 dose.  Dispense: 0.5 mL; Refill: 0  Follow-up: Return in about 6 months (around 09/24/2024).  Debby Molt, MD

## 2024-03-29 LAB — URINALYSIS, ROUTINE W REFLEX MICROSCOPIC
Bilirubin Urine: NEGATIVE
Hgb urine dipstick: NEGATIVE
Ketones, ur: NEGATIVE
Leukocytes,Ua: NEGATIVE
Nitrite: NEGATIVE
RBC / HPF: NONE SEEN (ref 0–?)
Specific Gravity, Urine: 1.02 (ref 1.000–1.030)
Total Protein, Urine: NEGATIVE
Urine Glucose: NEGATIVE
Urobilinogen, UA: 0.2 (ref 0.0–1.0)
pH: 6 (ref 5.0–8.0)

## 2024-04-17 ENCOUNTER — Encounter: Payer: Self-pay | Admitting: Emergency Medicine

## 2024-04-17 ENCOUNTER — Ambulatory Visit
Admission: EM | Admit: 2024-04-17 | Discharge: 2024-04-17 | Disposition: A | Discharge status: Home / Self Care | Attending: Emergency Medicine | Admitting: Emergency Medicine

## 2024-04-17 DIAGNOSIS — R21 Rash and other nonspecific skin eruption: Secondary | ICD-10-CM

## 2024-04-17 DIAGNOSIS — M79602 Pain in left arm: Secondary | ICD-10-CM

## 2024-04-17 MED ORDER — DOXYCYCLINE HYCLATE 100 MG PO CAPS: 100.0000 mg | ORAL_CAPSULE | Freq: Two times a day (BID) | ORAL | 0 refills | Status: DC

## 2024-04-17 MED ORDER — MUPIROCIN 2 % EX OINT: 1.0000 | TOPICAL_OINTMENT | Freq: Two times a day (BID) | CUTANEOUS | 0 refills | Status: AC

## 2024-04-17 MED ORDER — DOXYCYCLINE HYCLATE 100 MG PO CAPS: 100.0000 mg | ORAL_CAPSULE | Freq: Two times a day (BID) | ORAL | 0 refills | Status: AC

## 2024-04-17 NOTE — ED Triage Notes (Signed)
 Pt presents c/o swelling on left arm. Pt says she noticed two little bumps on Tuesday on her forearm near the bend. The area where the bumps were located is now a little swollen and there is 2 little black dots on place of the two bumps. Pt has tried Triamcinolone  for treatment.

## 2024-04-17 NOTE — ED Provider Notes (Signed)
 [facility]    CSN: [id number] Arrival date & time: 04/17/24  0836      History   Chief Complaint Chief Complaint  Patient presents with   Swelling    HPI Beth Aguilar is a 68 y.o. female.  Here with 4-5 day history of red spot on the left inner arm Maybe 2 days ago it started to develop pressure feeling. Not necessarily pain. No itching reported. She noticed two black dots in the center. Not sure if something bit her. This morning it seems there is more redness  No drainage from the area No fever or chills  Has used triamcinolone  cream topically Last night used ice once  Concerned because she is going out of town today  Past Medical History:  Diagnosis Date   Anal fissure    Arthritis    Hearing loss    HLD (hyperlipidemia)    HTN (hypertension)    Kidney stone    Urticaria     Patient Active Problem List   Diagnosis Date Noted   Encounter for general adult medical examination with abnormal findings 03/24/2024   Need for prophylactic vaccination with combined diphtheria-tetanus-pertussis (DTP) vaccine 03/24/2024   Sensorineural hearing loss (SNHL) of both ears 12/22/2022   Gastroesophageal reflux disease with esophagitis and hemorrhage 12/22/2022   Memory loss 12/22/2022   Dyslipidemia, goal LDL below 100 09/01/2022   Primary hypertension 09/01/2022   Estrogen deficiency 09/01/2022   Screen for colon cancer 09/01/2022   Osteopenia 06/10/2017   Other seasonal allergic rhinitis 05/08/2015   Routine general medical examination at a health care facility 11/04/2014    Past Surgical History:  Procedure Laterality Date   CESAREAN SECTION     x2   Colorectal Surgery     x 2, 1982 and 1991   ROTATOR CUFF REPAIR Left    TONSILLECTOMY AND ADENOIDECTOMY  1965    OB History   No obstetric history on file.      Home Medications    Prior to Admission medications   Medication Sig Start Date End Date Taking? Authorizing Provider  mupirocin   ointment (BACTROBAN ) 2 % Apply 1 Application topically 2 (two) times daily. 04/17/24  Yes Maliek Schellhorn, [place], PA-C  aspirin  81 MG tablet Take 1 tablet (81 mg total) by mouth daily. 03/11/17   Norleen Lynwood ORN, MD  CALCIUM  CITRATE PO 1 TABLET PO DAILY    [provider]  Cholecalciferol (VITAMIN D3 PO) 1 CAPSULE PO DAILY    [provider]  doxycycline  (VIBRAMYCIN ) 100 MG capsule Take 1 capsule (100 mg total) by mouth 2 (two) times daily for 7 days. 04/17/24 04/24/24  Shaheen Star, [place], PA-C  EPINEPHrine  0.3 mg/0.3 mL IJ SOAJ injection Inject 0.3 mg into the muscle as needed for anaphylaxis. 09/30/22   Kozlow, Eric J, MD  naproxen (NAPROSYN) 500 MG tablet Take 500 mg by mouth 2 (two) times daily with a meal. As needed - [facility]    [provider]  Probiotic Product (PROBIOTIC PO) 1 CAPSULE PO DAILY    [provider]  rosuvastatin  (CRESTOR ) 10 MG tablet TAKE 1 TABLET (10 MG TOTAL) BY MOUTH DAILY. 06/19/23   Rollene Almarie LABOR, MD    Family History Family History  Problem Relation Age of Onset   Depression Mother    Arthritis Mother    Heart disease Mother    Hypertension Mother    Hyperlipidemia Mother    Stroke Father    Pancreatic cancer Maternal Uncle  Pancreatic cancer Cousin        uncles daughter    Social History Social History   Tobacco Use   Smoking status: Never    Passive exposure: Never   Smokeless tobacco: Never  Vaping Use   Vaping status: Never Used  Substance Use Topics   Alcohol use: No   Drug use: No     Allergies   Flonase [fluticasone propionate], Compazine [prochlorperazine], Gadolinium derivatives, Guaifenesin & derivatives, Macadamia nut oil, Pistachio nut (diagnostic), and Sulfa antibiotics   Review of Systems Review of Systems As per HPI  Physical Exam Triage Vital Signs ED Triage Vitals  Encounter Vitals Group     BP 04/17/24 0904 (!) 154/86     Girls Systolic BP Percentile --      Girls Diastolic BP  Percentile --      Boys Systolic BP Percentile --      Boys Diastolic BP Percentile --      Pulse Rate 04/17/24 0904 76     Resp 04/17/24 0904 18     Temp 04/17/24 0904 97.7 F (36.5 C)     Temp Source 04/17/24 0904 Oral     SpO2 04/17/24 0904 98 %     Weight 04/17/24 0903 126 lb 15.8 oz (57.6 kg)     Height --      Head Circumference --      Peak Flow --      Pain Score 04/17/24 0900 0     Pain Loc --      Pain Education --      Exclude from Growth Chart --    No data found.  Updated Vital Signs BP (!) 154/86 (BP Location: Left Arm)   Pulse 76   Temp 97.7 F (36.5 C) (Oral)   Resp 18   Wt 126 lb 15.8 oz (57.6 kg)   SpO2 98%   BMI 25.65 kg/m    Physical Exam Vitals and nursing note reviewed.  Constitutional:      General: She is not in acute distress.    Appearance: Normal appearance.  HENT:     Mouth/Throat:     Mouth: Mucous membranes are moist.     Pharynx: Oropharynx is clear. No posterior oropharyngeal erythema.  Eyes:     Conjunctiva/sclera: Conjunctivae normal.     Pupils: Pupils are equal, round, and reactive to light.  Cardiovascular:     Rate and Rhythm: Normal rate and regular rhythm.     Pulses: Normal pulses.     Heart sounds: Normal heart sounds.  Pulmonary:     Effort: Pulmonary effort is normal.     Breath sounds: Normal breath sounds.  Musculoskeletal:        General: Normal range of motion.     Cervical back: Normal range of motion.     Comments: Full range of motion of left upper extremity.  Strength 5/5.  Distal sensation intact.  Radial pulse 2+.  Cap refill less than 2 seconds  Skin:    General: Skin is warm and dry.     Findings: Erythema present.         Comments: 2 areas of erythema with mild swelling about 1 inch diameter each.  The distal area has 2 punctate lesions centrally that look to be scabbed over.  There is no fluctuance, induration, tenderness, drainage.  Not hot to touch  Neurological:     Mental Status: She is alert  and oriented to person, place, and time.  UC Treatments / Results  Labs (all labs ordered are listed, but only abnormal results are displayed) Labs Reviewed - No data to display  EKG  Radiology No results found.  Procedures Procedures   Medications Ordered in UC Medications - No data to display  Initial Impression / Assessment and Plan / UC Course  I have reviewed the triage vital signs and the nursing notes.  Pertinent labs & imaging results that were available during my care of the patient were reviewed by me and considered in my medical decision making (see chart for details).   2 erythematous areas with mild swelling.  No fluctuance or induration at this time.  Likely localized reaction to possible bite.  I have recommended Bactroban  ointment twice daily and applying ice for swelling.  Since she is going out of town, I have provided a paper prescription for doxycycline .  Patient understands she can fill this if she feels in about 2 or 3 days the symptoms have worsened. Precautions are discussed No questions at this time agrees to plan  Final Clinical Impressions(s) / UC Diagnoses   Final diagnoses:  Pain of left upper extremity  Rash     Discharge Instructions      Bactroban  ointment twice daily  Apply ice for 15-20 minutes, several times daily. Make sure to not apply ice directly to skin  If in 2 days you feel the area has worsened (more painful, swollen, red), please fill the prescription for the antibiotic doxycycline . It would be taken twice daily for 7 days in a row. Take with food to avoid upset stomach.    ED Prescriptions     Medication Sig Dispense Auth. Provider   mupirocin  ointment (BACTROBAN ) 2 % Apply 1 Application topically 2 (two) times daily. 15 g Yasira Engelson, PA-C   doxycycline  (VIBRAMYCIN ) 100 MG capsule  (Status: Discontinued) Take 1 capsule (100 mg total) by mouth 2 (two) times daily for 7 days. 14 capsule Lacye Mccarn, PA-C    doxycycline  (VIBRAMYCIN ) 100 MG capsule Take 1 capsule (100 mg total) by mouth 2 (two) times daily for 7 days. 14 capsule Krystina Strieter, [place], PA-C      PDMP not reviewed this encounter.   Tomika Eckles, [place], PA-C 04/17/24 1002

## 2024-04-17 NOTE — Discharge Instructions (Signed)
 Bactroban  ointment twice daily  Apply ice for 15-20 minutes, several times daily. Make sure to not apply ice directly to skin  If in 2 days you feel the area has worsened (more painful, swollen, red), please fill the prescription for the antibiotic doxycycline . It would be taken twice daily for 7 days in a row. Take with food to avoid upset stomach.

## 2024-04-20 DIAGNOSIS — L237 Allergic contact dermatitis due to plants, except food: Secondary | ICD-10-CM | POA: Diagnosis not present

## 2024-05-02 DIAGNOSIS — L237 Allergic contact dermatitis due to plants, except food: Secondary | ICD-10-CM | POA: Diagnosis not present

## 2024-05-19 LAB — HM MAMMOGRAPHY: HM Mammogram: NORMAL

## 2024-05-27 ENCOUNTER — Other Ambulatory Visit: Payer: Self-pay | Admitting: Internal Medicine

## 2024-05-27 DIAGNOSIS — E782 Mixed hyperlipidemia: Secondary | ICD-10-CM

## 2024-08-09 DIAGNOSIS — Z6825 Body mass index (BMI) 25.0-25.9, adult: Secondary | ICD-10-CM | POA: Diagnosis not present

## 2024-08-09 DIAGNOSIS — Z1231 Encounter for screening mammogram for malignant neoplasm of breast: Secondary | ICD-10-CM | POA: Diagnosis not present

## 2024-08-09 DIAGNOSIS — N763 Subacute and chronic vulvitis: Secondary | ICD-10-CM | POA: Diagnosis not present

## 2024-08-09 DIAGNOSIS — Z124 Encounter for screening for malignant neoplasm of cervix: Secondary | ICD-10-CM | POA: Diagnosis not present

## 2024-09-06 ENCOUNTER — Other Ambulatory Visit: Payer: Self-pay | Admitting: Internal Medicine

## 2024-09-06 DIAGNOSIS — E782 Mixed hyperlipidemia: Secondary | ICD-10-CM

## 2024-09-22 ENCOUNTER — Ambulatory Visit
Admission: RE | Admit: 2024-09-22 | Discharge: 2024-09-22 | Disposition: A | Discharge status: Home / Self Care | Source: Ambulatory Visit

## 2024-09-22 ENCOUNTER — Other Ambulatory Visit: Payer: Self-pay

## 2024-09-22 VITALS — BP 131/72 | HR 83 | Temp 98.2°F | Resp 18 | Ht 59.0 in | Wt 125.0 lb

## 2024-09-22 DIAGNOSIS — J029 Acute pharyngitis, unspecified: Secondary | ICD-10-CM

## 2024-09-22 DIAGNOSIS — R0989 Other specified symptoms and signs involving the circulatory and respiratory systems: Secondary | ICD-10-CM | POA: Diagnosis not present

## 2024-09-22 LAB — POCT RAPID STREP A (OFFICE): Rapid Strep A Screen: NEGATIVE

## 2024-09-22 LAB — POC COVID19/FLU A&B COMBO
Covid Antigen, POC: NEGATIVE
Influenza A Antigen, POC: NEGATIVE
Influenza B Antigen, POC: NEGATIVE

## 2024-09-22 MED ORDER — PREDNISONE 20 MG PO TABS: 20.0000 mg | ORAL_TABLET | Freq: Every day | ORAL | 0 refills | Status: AC

## 2024-09-22 NOTE — ED Provider Notes (Signed)
 [facility]    CSN: [id number] Arrival date & time: 09/22/24  1033      History   Chief Complaint Chief Complaint  Patient presents with   Sore Throat   Laryngitis    HPI Beth Aguilar is a 69 y.o. female.   HPI   Pt is here today for concerns of sore throat, voice changes/ loss, pain with swallowing. She states this has been going on for the past 3 days but voice loss started yesterday. She states she is also having a cough, states it is productive first thing in the AM. She states she also had a single episode of diarrhea and vomiting this AM. She denies blood in diarrhea or vomit.  She reports her husband was sick with viral illness but has had different symptoms Interventions: Delsym, lozenges     Past Medical History:  Diagnosis Date   Anal fissure    Arthritis    Hearing loss    HLD (hyperlipidemia)    HTN (hypertension)    Kidney stone    Urticaria     Patient Active Problem List   Diagnosis Date Noted   Encounter for general adult medical examination with abnormal findings 03/24/2024   Need for prophylactic vaccination with combined diphtheria-tetanus-pertussis (DTP) vaccine 03/24/2024   Sensorineural hearing loss (SNHL) of both ears 12/22/2022   Gastroesophageal reflux disease with esophagitis and hemorrhage 12/22/2022   Memory loss 12/22/2022   Dyslipidemia, goal LDL below 100 09/01/2022   Primary hypertension 09/01/2022   Estrogen deficiency 09/01/2022   Screen for colon cancer 09/01/2022   Osteopenia 06/10/2017   Other seasonal allergic rhinitis 05/08/2015   Routine general medical examination at a health care facility 11/04/2014    Past Surgical History:  Procedure Laterality Date   CESAREAN SECTION     x2   Colorectal Surgery     x 2, 1982 and 1991   ROTATOR CUFF REPAIR Left    TONSILLECTOMY AND ADENOIDECTOMY  1965    OB History   No obstetric history on file.      Home Medications    Prior to Admission medications   Medication Sig Start Date End Date Taking? Authorizing Provider  predniSONE  (DELTASONE ) 20 MG tablet Take 1 tablet (20 mg total) by mouth daily for 5 days. 09/22/24 09/27/24 Yes Jamira Barfuss E, PA-C  aspirin  81 MG tablet Take 1 tablet (81 mg total) by mouth daily. 03/11/17   Norleen Lynwood ORN, MD  CALCIUM  CITRATE PO 1 TABLET PO DAILY    [provider]  Cholecalciferol (VITAMIN D3 PO) 1 CAPSULE PO DAILY    [provider]  EPINEPHrine  0.3 mg/0.3 mL IJ SOAJ injection Inject 0.3 mg into the muscle as needed for anaphylaxis. 09/30/22   Kozlow, Eric J, MD  mupirocin  ointment (BACTROBAN ) 2 % Apply 1 Application topically 2 (two) times daily. 04/17/24   Rising, Asberry, PA-C  naproxen (NAPROSYN) 500 MG tablet Take 500 mg by mouth 2 (two) times daily with a meal. As needed - [employer] Se[employer]ologist, Historical, MD  Probiotic Product (PROBIOTIC PO) 1 CAPSULE PO DAILY    [provider]  rosuvastatin  (CRESTOR ) 10 MG tablet Take 1 tablet (10 mg total) by mouth daily. NEEDS APPOINTMENT FOR REFILLS. 09/06/24   Joshua Debby CROME, MD    Family History Family History  Problem Relation Age of Onset   Depression Mother    Arthritis Mother    Heart disease Mother    Hypertension  Mother    Hyperlipidemia Mother    Stroke Father    Pancreatic cancer Maternal Uncle    Pancreatic cancer Cousin        uncles daughter    Social History Social History[1]   Allergies   Flonase [fluticasone propionate], Compazine [prochlorperazine], Gadolinium derivatives, Guaifenesin & derivatives, Macadamia nut oil, Pistachio nut (diagnostic), and Sulfa antibiotics   Review of Systems Review of Systems  Constitutional:  Negative for chills and fever.  HENT:  Positive for sore throat and voice change. Negative for congestion, postnasal drip and rhinorrhea.   Respiratory:  Positive for cough. Negative for shortness of breath and wheezing.   Gastrointestinal:  Positive for diarrhea and vomiting.  Negative for nausea.  Musculoskeletal:  Negative for myalgias.  Skin:  Negative for rash.     Physical Exam Triage Vital Signs ED Triage Vitals  Encounter Vitals Group     BP 09/22/24 1047 131/72     Girls Systolic BP Percentile --      Girls Diastolic BP Percentile --      Boys Systolic BP Percentile --      Boys Diastolic BP Percentile --      Pulse Rate 09/22/24 1047 83     Resp 09/22/24 1047 18     Temp 09/22/24 1047 98.2 F (36.8 C)     Temp Source 09/22/24 1047 Oral     SpO2 09/22/24 1047 96 %     Weight 09/22/24 1044 125 lb (56.7 kg)     Height 09/22/24 1044 4' 11 (1.499 m)     Head Circumference --      Peak Flow --      Pain Score 09/22/24 1044 8     Pain Loc --      Pain Education --      Exclude from Growth Chart --    No data found.  Updated Vital Signs BP 131/72 (BP Location: Right Arm)   Pulse 83   Temp 98.2 F (36.8 C) (Oral)   Resp 18   Ht 4' 11 (1.499 m)   Wt 125 lb (56.7 kg)   SpO2 96%   BMI 25.25 kg/m   Visual Acuity Right Eye Distance:   Left Eye Distance:   Bilateral Distance:    Right Eye Near:   Left Eye Near:    Bilateral Near:     Physical Exam Vitals reviewed.  Constitutional:      General: She is awake.     Appearance: Normal appearance. She is well-developed and well-groomed.  HENT:     Head: Normocephalic and atraumatic.     Right Ear: Hearing, tympanic membrane and ear canal normal.     Left Ear: Hearing, tympanic membrane and ear canal normal.     Mouth/Throat:     Lips: Pink.     Mouth: Mucous membranes are moist.     Pharynx: Oropharynx is clear. Uvula midline. Posterior oropharyngeal erythema present. No pharyngeal swelling, oropharyngeal exudate, uvula swelling or postnasal drip.     Tonsils: No tonsillar exudate. 0 on the right. 0 on the left.  Cardiovascular:     Rate and Rhythm: Normal rate and regular rhythm.     Pulses: Normal pulses.          Radial pulses are 2+ on the right side and 2+ on the left side.      Heart sounds: Normal heart sounds. No murmur heard.    No friction rub. No gallop.  Pulmonary:  Effort: Pulmonary effort is normal.     Breath sounds: Normal breath sounds. No decreased air movement. No decreased breath sounds, wheezing, rhonchi or rales.  Musculoskeletal:     Cervical back: Normal range of motion and neck supple.  Lymphadenopathy:     Head:     Right side of head: No submental, submandibular or preauricular adenopathy.     Left side of head: No submental, submandibular or preauricular adenopathy.     Cervical:     Right cervical: No superficial cervical adenopathy.    Left cervical: No superficial cervical adenopathy.     Upper Body:     Right upper body: No supraclavicular adenopathy.     Left upper body: No supraclavicular adenopathy.  Neurological:     Mental Status: She is alert.  Psychiatric:        Mood and Affect: Mood normal.        Behavior: Behavior normal. Behavior is cooperative.      UC Treatments / Results  Labs (all labs ordered are listed, but only abnormal results are displayed) Labs Reviewed  POCT RAPID STREP A (OFFICE) - Normal  POC COVID19/FLU A&B COMBO    EKG   Radiology No results found.  Procedures Procedures (including critical care time)  Medications Ordered in UC Medications - No data to display  Initial Impression / Assessment and Plan / UC Course  I have reviewed the triage vital signs and the nursing notes.  Pertinent labs & imaging results that were available during my care of the patient were reviewed by me and considered in my medical decision making (see chart for details).      Final Clinical Impressions(s) / UC Diagnoses   Final diagnoses:  Symptoms of upper respiratory infection (URI)  Sore throat   Patient is here today with concern for sore throat, voice changes and pain with swallowing this been ongoing for the last 3 days.  She states that she is also having a cough and had a single episode  of diarrhea and vomiting this morning physical exam is notable for posterior oropharyngeal erythema and vitals are largely reassuring.  Testing is negative for COVID and flu.  Based on symptoms as well as chronicity I suspect viral URI.  Recommend continued use of OTC medications for symptomatic relief.  To further assist with symptoms we will send prednisone  5-day burst.  ED and return precautions reviewed and provided in AVS.  Follow up as needed.  Discharge Instructions   None    ED Prescriptions     Medication Sig Dispense Auth. Provider   predniSONE  (DELTASONE ) 20 MG tablet Take 1 tablet (20 mg total) by mouth daily for 5 days. 5 tablet Michae Grimley E, PA-C      PDMP not reviewed this encounter.     [1]  Social History Tobacco Use   Smoking status: Never    Passive exposure: Never   Smokeless tobacco: Never  Vaping Use   Vaping status: Never Used  Substance Use Topics   Alcohol use: No   Drug use: No     Nunzio Banet, Rocky BRAVO, PA-C 09/26/24 1102  "

## 2024-09-22 NOTE — ED Triage Notes (Signed)
 Pt presents with a chief complaint of sore throat x 3 days. Has now lost her voice. No fevers. This morning she vomited and had an episode of diarrhea. Currently rates overall pain an 8/10. Does endorse sick contacts. OTC Delsym taken for symptoms with improvement. Helped improve throat pain and cough.

## 2024-09-27 ENCOUNTER — Ambulatory Visit: Admitting: Internal Medicine

## 2024-09-27 ENCOUNTER — Encounter: Payer: Self-pay | Admitting: Internal Medicine

## 2024-09-27 VITALS — BP 134/78 | HR 80 | Temp 98.3°F | Ht 59.0 in | Wt 128.0 lb

## 2024-09-27 DIAGNOSIS — J069 Acute upper respiratory infection, unspecified: Secondary | ICD-10-CM

## 2024-09-27 NOTE — Progress Notes (Signed)
 "   Subjective:    Patient ID: Beth Aguilar, female    DOB: 19-Jul-1956, 69 y.o.   MRN: [id number]      HPI Beth Aguilar is here for  Chief Complaint  Patient presents with   Sore Throat    Seen at [facility] on 09/22/24; Throat hurts still; She wants to know if there are any other meds she can take (she is unable to take Mucinex)   Went to urgent care 1/1 for 3 days of sore throat, laryngitis,pain with swallowing, cough that was productive first thing in the morning.  He also had a single episode of diarrhea and vomiting that morning.  Vital signs were normal.  She did have some posterior oropharyngeal erythema.  Test for strep, COVID and flu were negative.  Thought to be a viral URI.  Prescribed prednisone  20 mg daily for 5 days.  Last dose is today.  Also taking Delsym, Advil    Discussed the use of AI scribe software for clinical note transcription with the patient, who gave verbal consent to proceed.  History of Present Illness Beth Aguilar is a 69 year old female who presents with persistent sore throat and cough.  Symptoms began on December 25th with cough and mild fever. By December 29th, she felt worse, and by December 31st, she experienced severe throat pain and loss of voice. She visited urgent care on January 1st, where tests for strep, COVID, and flu were negative. She was prescribed prednisone  and has been taking 20 mg daily, which temporarily alleviates symptoms, but pain returns by the next morning. She uses Delsym cough medicine twice daily, which helps with mucus and cough, but symptoms return each morning. Slight fever noted, with temperature reaching 35F once. Despite symptoms, she maintains energy levels, even walking two miles one day.  Throat pain is severe, affecting her ability to speak, and her voice is significantly impacted. She has slight ear pain, nasal congestion, and headaches. Dizziness has occurred in the last few days, which she finds unusual. No significant change in  appetite, though she ate less during the initial days of illness.  On January 1st, she experienced vomiting and diarrhea, which were isolated to that day. Severe lower back pain occurred for three to four days, reminiscent of kidney stones, but without urinary symptoms or hematuria. Managed with increased water intake and Advil.  History of kidney stones. Cautious about taking medications like Mucinex due to past adverse reaction. Taking Advil for pain relief and tried Mucinex once without notable benefit.     Medications and allergies reviewed with patient and updated if appropriate.  Medications Ordered Prior to Encounter[1]  Review of Systems  Constitutional:  Positive for fatigue (the past couple of days) and fever (mild low grade). Negative for appetite change.  HENT:  Positive for congestion, ear pain (slight), sore throat and voice change. Negative for sinus pressure and sinus pain.   Respiratory:  Positive for cough (wet in morning, dry). Negative for chest tightness, shortness of breath and wheezing.   Gastrointestinal:  Positive for diarrhea (on 1/1 only) and vomiting (on 1/1 only).  Musculoskeletal:  Positive for back pain (mid - lower back pain x 3-4 days). Negative for myalgias.  Neurological:  Positive for light-headedness and headaches (slight).       Objective:   Vitals:   09/27/24 1013  BP: 134/78  Pulse: 80  Temp: 98.3 F (36.8 C)  SpO2: 98%   BP Readings from Last 3 Encounters:  09/27/24 134/78  09/22/24 131/72  04/17/24 (!) 154/86   Wt Readings from Last 3 Encounters:  09/27/24 128 lb (58.1 kg)  09/22/24 125 lb (56.7 kg)  04/17/24 126 lb 15.8 oz (57.6 kg)   Body mass index is 25.85 kg/m.    Physical Exam Constitutional:      General: She is not in acute distress.    Appearance: Normal appearance. She is not ill-appearing.  HENT:     Head: Normocephalic and atraumatic.     Right Ear: Tympanic membrane, ear canal and external ear normal.     Left  Ear: Tympanic membrane, ear canal and external ear normal.     Mouth/Throat:     Mouth: Mucous membranes are moist.     Pharynx: No oropharyngeal exudate or posterior oropharyngeal erythema.  Eyes:     Conjunctiva/sclera: Conjunctivae normal.  Cardiovascular:     Rate and Rhythm: Normal rate and regular rhythm.  Pulmonary:     Effort: Pulmonary effort is normal. No respiratory distress.     Breath sounds: Normal breath sounds. No wheezing or rales.  Musculoskeletal:     Cervical back: Neck supple. No tenderness.  Lymphadenopathy:     Cervical: No cervical adenopathy.  Skin:    General: Skin is warm and dry.  Neurological:     Mental Status: She is alert.            Assessment & Plan:      Assessment and Plan Assessment & Plan Acute upper respiratory infection Symptoms suggest viral etiology. No bacterial infection suspected. - Continue Advil for inflammation and pain. - Alternate with Tylenol for pain relief. - Use saltwater gargles for sore throat. - Maintain hydration and rest. - Use Delsym for cough suppression at night. - Consider prescription cough syrup if severe cough affects sleep. - Monitor symptoms, resume activities when energy improves and infectiousness decreases.  Call if symptoms are not improving or worsening        [1]  Current Outpatient Medications on File Prior to Visit  Medication Sig Dispense Refill   aspirin  81 MG tablet Take 1 tablet (81 mg total) by mouth daily. 100 tablet 99   CALCIUM  CITRATE PO 1 TABLET PO DAILY     Cholecalciferol (VITAMIN D3 PO) 1 CAPSULE PO DAILY     EPINEPHrine  0.3 mg/0.3 mL IJ SOAJ injection Inject 0.3 mg into the muscle as needed for anaphylaxis. 1 each 1   mupirocin  ointment (BACTROBAN ) 2 % Apply 1 Application topically 2 (two) times daily. 15 g 0   naproxen (NAPROSYN) 500 MG tablet Take 500 mg by mouth 2 (two) times daily with a meal. As needed - [facility]     predniSONE  (DELTASONE ) 20 MG tablet  Take 1 tablet (20 mg total) by mouth daily for 5 days. 5 tablet 0   Probiotic Product (PROBIOTIC PO) 1 CAPSULE PO DAILY     rosuvastatin  (CRESTOR ) 10 MG tablet Take 1 tablet (10 mg total) by mouth daily. NEEDS APPOINTMENT FOR REFILLS. 30 tablet 0   No current facility-administered medications on file prior to visit.   "

## 2024-09-27 NOTE — Patient Instructions (Signed)

## 2024-09-28 ENCOUNTER — Ambulatory Visit: Admitting: Internal Medicine

## 2024-09-29 ENCOUNTER — Encounter: Payer: Self-pay | Admitting: Internal Medicine

## 2024-09-29 MED ORDER — MOXIFLOXACIN HCL 0.5 % OP SOLN: 1.0000 [drp] | Freq: Three times a day (TID) | OPHTHALMIC | 0 refills | Status: DC

## 2024-10-07 ENCOUNTER — Telehealth: Payer: Self-pay

## 2024-10-07 MED ORDER — VALACYCLOVIR HCL 1 G PO TABS: ORAL_TABLET | ORAL | 0 refills | Status: DC

## 2024-10-07 NOTE — Addendum Note (Signed)
 Addended by: GEOFM GLADE PARAS on: 10/07/2024 05:12 PM   Modules accepted: Orders

## 2024-10-07 NOTE — Telephone Encounter (Signed)
 I can send her in 1 of those 2 if she wants.  I am not sure if this is something she has had before or not

## 2024-10-07 NOTE — Telephone Encounter (Signed)
 Fever blisters or cold sores are likely related to the immune system being down or being sick-as reactivation of herpes virus.  Assuming she has had this before.  Sometimes treated with a topical cream or an antiviral that it is taken for 1 day that often helps this go away quicker.  Typically there would only be 1 or 2.

## 2024-10-07 NOTE — Telephone Encounter (Signed)
 Copied from CRM [id number]. Topic: Clinical - Medical Advice >> Oct 07, 2024 11:08 AM Delon T wrote: Reason for CRM: Patient now has fever blisters on her lip, still taking medication for eye infection- [phone]

## 2024-10-11 ENCOUNTER — Other Ambulatory Visit: Payer: Self-pay | Admitting: Internal Medicine

## 2024-10-11 DIAGNOSIS — E782 Mixed hyperlipidemia: Secondary | ICD-10-CM

## 2024-10-13 ENCOUNTER — Telehealth: Payer: Self-pay

## 2024-10-13 NOTE — Telephone Encounter (Signed)
 Copied from CRM #[id number]. Topic: Clinical - Prescription Issue >> Oct 13, 2024 11:46 AM Alfonso HERO wrote: Reason for CRM: patient calling for a reason why her refill was denied for Rosuvastatin  Calcium  10 mg Oral Daily. Asking for a call back.

## 2024-10-14 ENCOUNTER — Other Ambulatory Visit: Payer: Self-pay

## 2024-10-14 ENCOUNTER — Encounter: Payer: Self-pay | Admitting: Internal Medicine

## 2024-10-14 DIAGNOSIS — E782 Mixed hyperlipidemia: Secondary | ICD-10-CM

## 2024-10-14 MED ORDER — ROSUVASTATIN CALCIUM 10 MG PO TABS: 10.0000 mg | ORAL_TABLET | Freq: Every day | ORAL | 0 refills | Status: DC

## 2024-10-14 NOTE — Telephone Encounter (Signed)
 Patient has been scheduled for a follow up appointment.

## 2024-10-15 ENCOUNTER — Ambulatory Visit
Admission: RE | Admit: 2024-10-15 | Discharge: 2024-10-15 | Disposition: A | Discharge status: Home / Self Care | Attending: Physician Assistant | Admitting: Physician Assistant

## 2024-10-15 ENCOUNTER — Other Ambulatory Visit: Payer: Self-pay

## 2024-10-15 ENCOUNTER — Ambulatory Visit (HOSPITAL_COMMUNITY): Payer: Self-pay

## 2024-10-15 VITALS — BP 166/88 | HR 76 | Temp 98.1°F | Resp 16 | Ht 59.75 in | Wt 124.0 lb

## 2024-10-15 DIAGNOSIS — H1033 Unspecified acute conjunctivitis, bilateral: Secondary | ICD-10-CM | POA: Diagnosis not present

## 2024-10-15 MED ORDER — ERYTHROMYCIN 5 MG/GM OP OINT: TOPICAL_OINTMENT | Freq: Four times a day (QID) | OPHTHALMIC | 0 refills | Status: AC

## 2024-10-15 NOTE — ED Notes (Signed)
 Pt states BP is abnormally elevated for her. Pt requests recheck after sitting for a little while.

## 2024-10-15 NOTE — ED Triage Notes (Signed)
 Pt presents with c/o bilateral eye redness. States there is pus and drainage coming from both eyes. No pain. Does voice constant irritation although. Had this occur earlier in the month and her provider called in Moxifloxacin . Last applied five days ago. Feels like this medication helped sxs before but is almost out of the prescription.

## 2024-10-15 NOTE — Discharge Instructions (Addendum)
? ?  Based on your symptoms I believe that you have bacterial conjunctivitis  ?I have sent in a script for Erythromycin ophthalmic ointment - please apply a 1/2 inch strip of the ointment to your right eye every 6 hours for 7 days  ?You can use sterile eye flushes and lubricating eye drops to assist with eye irritation and further resolution ?You can also use a warm compress over the eye to assist with swelling and matting - especially in the morning  ?If you have used makeup or mascara on that eye I recommend discarding it as this can cause recurrent infection. Thoroughly wash any makeup brushes and avoid using makeup while recovering from the infection.  ?If you notice the following please return to the office: lack of improvement, eyelid swelling, increased eye irritation ?If you notice the following please go to the ED: eye pressure causing displacement of the eye, vision changes, increased eye pain or foreign body sensation, fever ? ?

## 2024-10-15 NOTE — ED Provider Notes (Signed)
 " [facility]    CSN: [id number] Arrival date & time: 10/15/24  1348      History   Chief Complaint Chief Complaint  Patient presents with   Eye Problem    Both eyes had pus in the corners this morning.  Both were also very weepy. - Entered by patient    HPI Beth Aguilar is a 69 y.o. female.   HPI  Pt is here today with concerns for bilateral eye drainage. She states her eyes are very watery and feel gritty. She reports she did have redness yesterday and this AM she had some purulent drainage in the corners. She reports she developed this after she was seen for URI symptoms earlier in the month and took abx eye drops, Moxifloxacin  for about 2 weeks. She stopped this about 5 days ago and then developed redness and watery eyes.  She denies swelling around the eyes and denies outright eye pain but does admit to irritation  She does not wear contacts    Past Medical History:  Diagnosis Date   Anal fissure    Arthritis    Hearing loss    HLD (hyperlipidemia)    HTN (hypertension)    Kidney stone    Urticaria     Patient Active Problem List   Diagnosis Date Noted   Encounter for general adult medical examination with abnormal findings 03/24/2024   Need for prophylactic vaccination with combined diphtheria-tetanus-pertussis (DTP) vaccine 03/24/2024   Sensorineural hearing loss (SNHL) of both ears 12/22/2022   Gastroesophageal reflux disease with esophagitis and hemorrhage 12/22/2022   Memory loss 12/22/2022   Dyslipidemia, goal LDL below 100 09/01/2022   Primary hypertension 09/01/2022   Estrogen deficiency 09/01/2022   Screen for colon cancer 09/01/2022   Osteopenia 06/10/2017   Other seasonal allergic rhinitis 05/08/2015   Routine general medical examination at a health care facility 11/04/2014    Past Surgical History:  Procedure Laterality Date   CESAREAN SECTION     x2   Colorectal Surgery     x 2, 1982 and 1991   ROTATOR CUFF REPAIR Left     TONSILLECTOMY AND ADENOIDECTOMY  1965    OB History   No obstetric history on file.      Home Medications    Prior to Admission medications  Medication Sig Start Date End Date Taking? Authorizing Provider  erythromycin  ophthalmic ointment Place into both eyes 4 (four) times daily for 7 days. Place a 1/2 inch ribbon of ointment into the lower eyelid. 10/15/24 10/22/24 Yes Ziquan Fidel E, PA-C  aspirin  81 MG tablet Take 1 tablet (81 mg total) by mouth daily. 03/11/17   Norleen Lynwood ORN, MD  CALCIUM  CITRATE PO 1 TABLET PO DAILY    [provider]  Cholecalciferol (VITAMIN D3 PO) 1 CAPSULE PO DAILY    [provider]  EPINEPHrine  0.3 mg/0.3 mL IJ SOAJ injection Inject 0.3 mg into the muscle as needed for anaphylaxis. 09/30/22   Kozlow, Eric J, MD  mupirocin  ointment (BACTROBAN ) 2 % Apply 1 Application topically 2 (two) times daily. 04/17/24   Rising, Asberry, PA-C  naproxen (NAPROSYN) 500 MG tablet Take 500 mg by mouth 2 (two) times daily with a meal. As needed - Field Seismologist, Historical, MD  Probiotic Product (PROBIOTIC PO) 1 CAPSULE PO DAILY    [provider]  rosuvastatin  (CRESTOR ) 10 MG tablet Take 1 tablet (10 mg total) by mouth daily. NEEDS APPOINTMENT FOR REFILLS. 10/14/24  Joshua Debby CROME, MD  valACYclovir  (VALTREX ) 1000 MG tablet Take 2 pills every 12 hours x 1 day 10/07/24   Geofm Glade PARAS, MD    Family History Family History  Problem Relation Age of Onset   Depression Mother    Arthritis Mother    Heart disease Mother    Hypertension Mother    Hyperlipidemia Mother    Stroke Father    Pancreatic cancer Maternal Uncle    Pancreatic cancer Cousin        uncles daughter    Social History Social History[1]   Allergies   Flonase [fluticasone propionate], Compazine [prochlorperazine], Gadolinium derivatives, Guaifenesin & derivatives, Macadamia nut oil, Pistachio nut (diagnostic), and Sulfa antibiotics   Review of Systems Review of  Systems  Constitutional:  Negative for chills and fever.  Eyes:  Positive for discharge and redness. Negative for photophobia, pain, itching and visual disturbance.     Physical Exam Triage Vital Signs ED Triage Vitals  Encounter Vitals Group     BP 10/15/24 1418 (!) 166/88     Girls Systolic BP Percentile --      Girls Diastolic BP Percentile --      Boys Systolic BP Percentile --      Boys Diastolic BP Percentile --      Pulse Rate 10/15/24 1418 76     Resp 10/15/24 1418 16     Temp 10/15/24 1418 98.1 F (36.7 C)     Temp Source 10/15/24 1418 Oral     SpO2 10/15/24 1418 96 %     Weight 10/15/24 1418 124 lb (56.2 kg)     Height 10/15/24 1418 4' 11.75 (1.518 m)     Head Circumference --      Peak Flow --      Pain Score 10/15/24 1436 0     Pain Loc --      Pain Education --      Exclude from Growth Chart --    No data found.  Updated Vital Signs BP (!) 166/88 (BP Location: Right Arm)   Pulse 76   Temp 98.1 F (36.7 C) (Oral)   Resp 16   Ht 4' 11.75 (1.518 m)   Wt 124 lb (56.2 kg)   SpO2 96%   BMI 24.42 kg/m   Visual Acuity Right Eye Distance:   Left Eye Distance:   Bilateral Distance:    Right Eye Near:   Left Eye Near:    Bilateral Near:     Physical Exam Vitals reviewed.  Constitutional:      General: She is awake.     Appearance: Normal appearance. She is well-developed and well-groomed.  HENT:     Head: Normocephalic and atraumatic.  Eyes:     General: Lids are normal. Gaze aligned appropriately. No allergic shiner, visual field deficit or scleral icterus.       Right eye: Discharge present. No foreign body or hordeolum.        Left eye: No foreign body or hordeolum.     Extraocular Movements: Extraocular movements intact.     Conjunctiva/sclera: Conjunctivae normal.     Comments: Patient has minimal bilateral injection of the corneas and sclera.  Minimal evidence of watery discharge  Pulmonary:     Effort: Pulmonary effort is normal.   Neurological:     Mental Status: She is alert and oriented to person, place, and time.  Psychiatric:        Attention and Perception: Attention and perception  normal.        Mood and Affect: Mood and affect normal.        Speech: Speech normal.        Behavior: Behavior normal. Behavior is cooperative.      UC Treatments / Results  Labs (all labs ordered are listed, but only abnormal results are displayed) Labs Reviewed - No data to display  EKG   Radiology No results found.  Procedures Procedures (including critical care time)  Medications Ordered in UC Medications - No data to display  Initial Impression / Assessment and Plan / UC Course  I have reviewed the triage vital signs and the nursing notes.  Pertinent labs & imaging results that were available during my care of the patient were reviewed by me and considered in my medical decision making (see chart for details).      Final Clinical Impressions(s) / UC Diagnoses   Final diagnoses:  Acute conjunctivitis of both eyes, unspecified acute conjunctivitis type  Patient is here today with concerns of conjunctivitis.  She states that she was sick earlier at the beginning of the year and developed conjunctivitis symptoms and was treated with moxifloxacin .  She states that she administered this for 2 weeks and recently stopped about 5 days ago.  She states over the last few days she is starting to develop gritty sensation in the eyes as well as some goopy drainage this morning.  Given her symptoms as well as lack of improvement with previous eyedrops we will start erythromycin  ophthalmic ointment to assist with irritation and provide some relief.  Reviewed that she can use lubricating eyedrops such as blink tears or refresh and sterile eye flushes as well.  Reviewed that if her symptoms do not seem to be improving or start to worsen she should follow-up with ophthalmology.   Discharge Instructions      Based on your  symptoms I believe that you have bacterial conjunctivitis  I have sent in a script for Erythromycin  ophthalmic ointment - please apply a 1/2 inch strip of the ointment to your right eye every 6 hours for 7 days  You can use sterile eye flushes and lubricating eye drops to assist with eye irritation and further resolution You can also use a warm compress over the eye to assist with swelling and matting - especially in the morning  If you have used makeup or mascara on that eye I recommend discarding it as this can cause recurrent infection. Thoroughly wash any makeup brushes and avoid using makeup while recovering from the infection.  If you notice the following please return to the office: lack of improvement, eyelid swelling, increased eye irritation If you notice the following please go to the ED: eye pressure causing displacement of the eye, vision changes, increased eye pain or foreign body sensation, fever      ED Prescriptions     Medication Sig Dispense Auth. Provider   erythromycin  ophthalmic ointment Place into both eyes 4 (four) times daily for 7 days. Place a 1/2 inch ribbon of ointment into the lower eyelid. 3.5 g Aime Carreras E, PA-C      PDMP not reviewed this encounter.     [1]  Social History Tobacco Use   Smoking status: Never    Passive exposure: Never   Smokeless tobacco: Never  Vaping Use   Vaping status: Never Used  Substance Use Topics   Alcohol use: No   Drug use: No     Caius Silbernagel, Rocky  E, PA-C 10/15/24 1557  "

## 2024-10-26 ENCOUNTER — Ambulatory Visit: Payer: Self-pay | Admitting: Internal Medicine

## 2024-10-26 ENCOUNTER — Ambulatory Visit: Admitting: Internal Medicine

## 2024-10-26 ENCOUNTER — Encounter: Payer: Self-pay | Admitting: Internal Medicine

## 2024-10-26 VITALS — BP 138/86 | HR 87 | Temp 98.0°F | Resp 16 | Ht 59.0 in | Wt 123.0 lb

## 2024-10-26 DIAGNOSIS — Z23 Encounter for immunization: Secondary | ICD-10-CM

## 2024-10-26 DIAGNOSIS — E785 Hyperlipidemia, unspecified: Secondary | ICD-10-CM

## 2024-10-26 DIAGNOSIS — E782 Mixed hyperlipidemia: Secondary | ICD-10-CM

## 2024-10-26 DIAGNOSIS — I10 Essential (primary) hypertension: Secondary | ICD-10-CM

## 2024-10-26 LAB — CBC WITH DIFFERENTIAL/PLATELET
Basophils Absolute: 0.1 10*3/uL (ref 0.0–0.1)
Basophils Relative: 1.2 % (ref 0.0–3.0)
Eosinophils Absolute: 0.4 10*3/uL (ref 0.0–0.7)
Eosinophils Relative: 4.3 % (ref 0.0–5.0)
HCT: 39.2 % (ref 36.0–46.0)
Hemoglobin: 13.3 g/dL (ref 12.0–15.0)
Lymphocytes Relative: 29.5 % (ref 12.0–46.0)
Lymphs Abs: 2.5 10*3/uL (ref 0.7–4.0)
MCHC: 34 g/dL (ref 30.0–36.0)
MCV: 91.2 fl (ref 78.0–100.0)
Monocytes Absolute: 0.9 10*3/uL (ref 0.1–1.0)
Monocytes Relative: 10.5 % (ref 3.0–12.0)
Neutro Abs: 4.6 10*3/uL (ref 1.4–7.7)
Neutrophils Relative %: 54.5 % (ref 43.0–77.0)
Platelets: 202 10*3/uL (ref 150.0–400.0)
RBC: 4.29 Mil/uL (ref 3.87–5.11)
RDW: 13.2 % (ref 11.5–15.5)
WBC: 8.4 10*3/uL (ref 4.0–10.5)

## 2024-10-26 LAB — LIPID PANEL
Cholesterol: 155 mg/dL (ref 28–200)
HDL: 46.6 mg/dL
LDL Cholesterol: 44 mg/dL (ref 10–99)
NonHDL: 108.22
Total CHOL/HDL Ratio: 3
Triglycerides: 323 mg/dL — ABNORMAL HIGH (ref 10.0–149.0)
VLDL: 64.6 mg/dL — ABNORMAL HIGH (ref 0.0–40.0)

## 2024-10-26 LAB — HEPATIC FUNCTION PANEL
ALT: 22 U/L (ref 3–35)
AST: 22 U/L (ref 5–37)
Albumin: 4.3 g/dL (ref 3.5–5.2)
Alkaline Phosphatase: 48 U/L (ref 39–117)
Bilirubin, Direct: 0 mg/dL — ABNORMAL LOW (ref 0.1–0.3)
Total Bilirubin: 0.3 mg/dL (ref 0.2–1.2)
Total Protein: 6.9 g/dL (ref 6.0–8.3)

## 2024-10-26 LAB — CK: Total CK: 110 U/L (ref 17–177)

## 2024-10-26 LAB — BASIC METABOLIC PANEL WITH GFR
BUN: 17 mg/dL (ref 6–23)
CO2: 30 meq/L (ref 19–32)
Calcium: 9 mg/dL (ref 8.4–10.5)
Chloride: 104 meq/L (ref 96–112)
Creatinine, Ser: 0.73 mg/dL (ref 0.40–1.20)
GFR: 84.88 mL/min
Glucose, Bld: 101 mg/dL — ABNORMAL HIGH (ref 70–99)
Potassium: 3.8 meq/L (ref 3.5–5.1)
Sodium: 140 meq/L (ref 135–145)

## 2024-10-26 MED ORDER — BOOSTRIX 5-2.5-18.5 LF-MCG/0.5 IM SUSP: 0.5000 mL | Freq: Once | INTRAMUSCULAR | 0 refills | Status: AC

## 2024-10-26 MED ORDER — ROSUVASTATIN CALCIUM 10 MG PO TABS: 10.0000 mg | ORAL_TABLET | Freq: Every day | ORAL | 1 refills | Status: AC

## 2024-10-26 NOTE — Patient Instructions (Signed)
 High Blood Pressure (Hypertension) in Adults: What to Know High blood pressure is when the force of blood pumping through your arteries is too strong. This is also called hypertension. Arteries are blood vessels that carry blood from your heart to your body. If you have high blood pressure, your heart has to work harder to pump blood. This may cause the arteries to get narrow or stiff. High blood pressure can lead to a heart attack, heart failure, a stroke, or even kidney disease. What are the causes? In some cases, the cause isn't known. But there are many health problems that can cause high blood pressure. What increases the risk? You may be more likely to have high blood pressure if: You're older. You have a history of: Heart disease. High cholesterol. Kidney disease. You have a lot of stress in your life. Someone in your family has high blood pressure. You have sleep apnea. You're overweight. You may also be more likely to have high blood pressure if: You smoke. You don't get enough exercise. You eat and drink too much fat, sugar, calories, or salt (sodium). You drink too much alcohol. What are the signs or symptoms? In some cases, you may not have symptoms. But the higher your blood pressure, the more likely you are to have symptoms. These include: Headaches. Fast or uneven heartbeats. Feeling dizzy. Changes to your eyesight. Nosebleeds. Chest pain and feeling short of breath. Throwing up or feeling like you may throw up. How is this diagnosed? You may be diagnosed based on your health history, symptoms, and blood pressure reading. Healthy blood pressure for most adults is <120/80. The first number is the highest pressure reached in your arteries when your heart beats. The second number is the lowest pressure in your arteries when your heart rests between beats. If your blood pressure is above 130/80, you may be told you have high blood pressure. If you have a high blood  pressure reading during a visit, you may be asked to: Come back a different day to have your blood pressure checked again. Check your blood pressure at home for 1 week or longer. If you have high blood pressure, you may have blood or imaging tests done. How is this treated? You may need to: Change your diet. Take medicines to lower your blood pressure. Exercise more. Your target blood pressure will depend on your age and how healthy you are. Follow these instructions at home: Medicines Take your medicines only as told. Do not skip doses of blood pressure medicine. Tell your provider if you have any side effects from the medicine. Eating and drinking  Eat things that are good for your heart. You may want to try the Dietary Approaches to Stop Hypertension (DASH) diet. To eat this way: Eat lots of fresh fruits and vegetables. Try to fill half of your plate with fruits and vegetables. Fill about a fourth of your plate with whole grains. Whole grains include: Whole-wheat pasta. Brown rice. Whole-grain bread. Eat or drink low-fat dairy products, such as low-fat yogurt or skim milk. Avoid fatty cuts of meat, lunch meats, and poultry with skin. Fill about a fourth of your plate with low-fat (lean) proteins, such as: Fish. Chicken without skin. Beans. Eggs. Tofu. Avoid processed foods. These tend to be higher in salt, added sugar, and fat. Try to eat less than 1,500 mg of salt a day. Do not drink alcohol if: Your health care provider tells you not to drink. You're pregnant, may be pregnant,  or plan to become pregnant. If you drink alcohol: Limit how much you have to: 0-1 drink a day if you're female. 0-2 drinks a day if you're female. Know how much alcohol is in your drink. In the [place] one drink is one 12 oz bottle of beer (355 mL), one 5 oz glass of wine (148 mL), or one 1 oz glass of hard liquor (44 mL). Lifestyle  Stay at a healthy body weight. Ask what a good weight is for  you. Try to get 90-150 minutes per week of exercise that causes your heart to beat faster. This is called aerobic exercise. This may include: Walking. Swimming. Biking. Include exercises to strengthen your muscles for 30 minutes at least 3 days a week, such as: Pilates. Lifting weights. General instructions Do not smoke, vape, or use nicotine or tobacco. Check your blood pressure at home as told. Keep all follow-up visits. Your provider will want to check that your blood pressure is coming down. Contact a health care provider if: You have headaches. You feel dizzy. You have eyesight changes. Get help right away if: You have chest, back, or belly pain. You have trouble breathing. You faint. You have any signs of a stroke. BE FAST is an easy way to remember the main warning signs: B - Balance. Feeling dizzy, sudden trouble walking, or loss of balance. E - Eyes. Trouble seeing or a change in how you see. F - Face. Sudden weakness or feeling numb in the face. The face or eyelid may droop on one side. A - Arms. Weakness or loss of feeling in an arm. This happens fast and often only on one side. S - Speech. Sudden trouble speaking, slurred speech, or trouble understanding what people say. T - Time. Time to call 911. Write down what time symptoms started. Other signs of a stroke can be: A sudden, very bad headache with no known cause. Feeling like you may throw up. Throwing up. These symptoms may be an emergency. Call 911 right away. Do not wait to see if the symptoms will go away. Do not drive yourself to the hospital. This information is not intended to replace advice given to you by your health care provider. Make sure you discuss any questions you have with your health care provider. Document Revised: 01/25/2024 Document Reviewed: 01/20/2024 Elsevier Patient Education  2025 [employer].

## 2024-10-26 NOTE — Progress Notes (Unsigned)
 "  Subjective:  Patient ID: Beth Aguilar, female    DOB: [birth year]  Age: 69 y.o. MRN: [id number]  CC: Hypertension and Hyperlipidemia   HPI Beth Aguilar presents for f/up ---  Discussed the use of AI scribe software for clinical note transcription with the patient, who gave verbal consent to proceed.  History of Present Illness Beth Aguilar is a 69 year old female who presents with persistent eye inflammation following an upper respiratory infection.  She has been experiencing persistent eye inflammation following an upper respiratory infection, which she believes was contracted from her husband who had conjunctivitis. Initially, she received treatment for the upper respiratory infection, which provided temporary relief. However, symptoms recurred after the medication ran out, prompting a visit to urgent care where she was prescribed erythromycin . After a week without improvement, she consulted an ophthalmologist who prescribed a third medication, the name of which she cannot recall. Her eyelids remain red and inflamed, though there is no pus, and they are frequently watery but not painful or itchy.  She has been experiencing back and knee aches, which she attributes to shoveling snow. No chest pain, shortness of breath, dizziness, or lightheadedness during physical activity.  She reports no side effects from her current medications and no recent changes in weight or appetite.     Outpatient Medications Prior to Visit  Medication Sig Dispense Refill   neomycin-polymyxin b-dexamethasone (MAXITROL) 3.5-10000-0.1 OINT SMARTSIG:0.25 Inch(es) In Eye(s) Every Night     aspirin  81 MG tablet Take 1 tablet (81 mg total) by mouth daily. 100 tablet 99   CALCIUM  CITRATE PO 1 TABLET PO DAILY     Cholecalciferol (VITAMIN D3 PO) 1 CAPSULE PO DAILY     EPINEPHrine  0.3 mg/0.3 mL IJ SOAJ injection Inject 0.3 mg into the muscle as needed for anaphylaxis. 1 each 1   mupirocin  ointment (BACTROBAN ) 2 %  Apply 1 Application topically 2 (two) times daily. 15 g 0   naproxen (NAPROSYN) 500 MG tablet Take 500 mg by mouth 2 (two) times daily with a meal. As needed - [facility]     Probiotic Product (PROBIOTIC PO) 1 CAPSULE PO DAILY     rosuvastatin  (CRESTOR ) 10 MG tablet Take 1 tablet (10 mg total) by mouth daily. NEEDS APPOINTMENT FOR REFILLS. 30 tablet 0   valACYclovir  (VALTREX ) 1000 MG tablet Take 2 pills every 12 hours x 1 day 4 tablet 0   No facility-administered medications prior to visit.    ROS Review of Systems  Objective:  BP 138/86 (BP Location: Right Arm, Patient Position: Sitting, Cuff Size: Normal)   Pulse 87   Temp 98 F (36.7 C) (Oral)   Resp 16   Ht 4' 11 (1.499 m)   Wt 123 lb (55.8 kg)   SpO2 97%   BMI 24.84 kg/m   BP Readings from Last 3 Encounters:  10/26/24 138/86  10/15/24 (!) 166/88  09/27/24 134/78    Wt Readings from Last 3 Encounters:  10/26/24 123 lb (55.8 kg)  10/15/24 124 lb (56.2 kg)  09/27/24 128 lb (58.1 kg)    Physical Exam  Lab Results  Component Value Date   WBC 7.2 03/24/2024   HGB 14.0 03/24/2024   HCT 41.2 03/24/2024   PLT 191.0 03/24/2024   GLUCOSE 95 03/24/2024   CHOL 185 08/04/2023   TRIG 286.0 (H) 08/04/2023   HDL 50.80 08/04/2023   LDLDIRECT 99.0 09/01/2022   LDLCALC 77 08/04/2023   ALT 17 08/04/2023   AST 20 08/04/2023  NA 137 03/24/2024   K 4.1 03/24/2024   CL 99 03/24/2024   CREATININE 0.81 03/24/2024   BUN 18 03/24/2024   CO2 29 03/24/2024   TSH 1.53 03/24/2024   INR 1.0 12/22/2022   HGBA1C 5.6 10/03/2020    No results found.  Assessment & Plan:  Dyslipidemia, goal LDL below 100 -     Lipid panel; Future -     Hepatic function panel; Future -     Rosuvastatin  Calcium ; Take 1 tablet (10 mg total) by mouth daily.  Dispense: 90 tablet; Refill: 1  Primary hypertension -     Basic metabolic panel with GFR; Future -     CBC with Differential/Platelet; Future -     Hepatic function panel;  Future -     Urinalysis, Routine w reflex microscopic; Future  Moderate mixed hyperlipidemia not requiring statin therapy  Need for prophylactic vaccination with combined diphtheria-tetanus-pertussis (DTP) vaccine -     Boostrix ; Inject 0.5 mLs into the muscle once for 1 dose.  Dispense: 0.5 mL; Refill: 0     Follow-up: Return in about 6 months (around 04/25/2025).  Debby Molt, MD "

## 2024-10-27 LAB — URINALYSIS, ROUTINE W REFLEX MICROSCOPIC
Bilirubin Urine: NEGATIVE
Hgb urine dipstick: NEGATIVE
Ketones, ur: NEGATIVE
Leukocytes,Ua: NEGATIVE
Nitrite: NEGATIVE
RBC / HPF: NONE SEEN
Specific Gravity, Urine: 1.015 (ref 1.000–1.030)
Total Protein, Urine: NEGATIVE
Urine Glucose: NEGATIVE
Urobilinogen, UA: 0.2 (ref 0.0–1.0)
pH: 7 (ref 5.0–8.0)

## 2025-01-09 ENCOUNTER — Ambulatory Visit

## 2025-01-09 ENCOUNTER — Encounter: Admitting: Internal Medicine

## 2025-04-26 ENCOUNTER — Ambulatory Visit: Admitting: Internal Medicine
